# Patient Record
Sex: Female | Born: 1935 | Race: Black or African American | Hispanic: No | Smoking: Never smoker
Health system: Southern US, Community
[De-identification: ages and names within clinical notes are randomized; demographics above are authoritative.]

## PROBLEM LIST (undated history)

## (undated) DIAGNOSIS — C801 Malignant (primary) neoplasm, unspecified: Secondary | ICD-10-CM

## (undated) DIAGNOSIS — R011 Cardiac murmur, unspecified: Secondary | ICD-10-CM

## (undated) DIAGNOSIS — E079 Disorder of thyroid, unspecified: Secondary | ICD-10-CM

## (undated) DIAGNOSIS — I10 Essential (primary) hypertension: Secondary | ICD-10-CM

## (undated) DIAGNOSIS — R42 Dizziness and giddiness: Secondary | ICD-10-CM

## (undated) HISTORY — PX: GASTROSTOMY TUBE PLACEMENT: SHX655

---

## 2005-06-20 ENCOUNTER — Other Ambulatory Visit: Payer: Self-pay

## 2005-06-20 ENCOUNTER — Emergency Department: Payer: Self-pay | Admitting: General Practice

## 2008-12-20 ENCOUNTER — Inpatient Hospital Stay: Payer: Self-pay | Admitting: Internal Medicine

## 2009-01-11 ENCOUNTER — Ambulatory Visit: Payer: Self-pay | Admitting: Family Medicine

## 2009-04-11 ENCOUNTER — Emergency Department: Payer: Self-pay | Admitting: Emergency Medicine

## 2009-12-25 ENCOUNTER — Ambulatory Visit: Payer: Self-pay | Admitting: Unknown Physician Specialty

## 2011-06-20 ENCOUNTER — Ambulatory Visit: Payer: Self-pay | Admitting: Sports Medicine

## 2012-12-25 ENCOUNTER — Emergency Department: Payer: Self-pay | Admitting: Emergency Medicine

## 2012-12-25 LAB — URINALYSIS, COMPLETE
Bacteria: NONE SEEN
Blood: NEGATIVE
Ketone: NEGATIVE
Leukocyte Esterase: NEGATIVE
Protein: NEGATIVE
RBC,UR: 1 /HPF (ref 0–5)
Specific Gravity: 1.012 (ref 1.003–1.030)
Squamous Epithelial: NONE SEEN
WBC UR: <1 /HPF (ref 0–5)

## 2012-12-25 LAB — COMPREHENSIVE METABOLIC PANEL
Albumin: 3.2 g/dL — ABNORMAL LOW (ref 3.4–5.0)
Alkaline Phosphatase: 109 U/L (ref 50–136)
Anion Gap: 3 — ABNORMAL LOW (ref 7–16)
BUN: 33 mg/dL — ABNORMAL HIGH (ref 7–18)
Bilirubin,Total: 0.3 mg/dL (ref 0.2–1.0)
Calcium, Total: 8.9 mg/dL (ref 8.5–10.1)
Chloride: 106 mmol/L (ref 98–107)
Creatinine: 0.93 mg/dL (ref 0.60–1.30)
EGFR (African American): >60
SGPT (ALT): 28 U/L (ref 12–78)
Sodium: 139 mmol/L (ref 136–145)
Total Protein: 6.7 g/dL (ref 6.4–8.2)

## 2012-12-25 LAB — CBC
MCH: 28.3 pg (ref 26.0–34.0)
MCHC: 33 g/dL (ref 32.0–36.0)
Platelet: 110 10*3/uL — ABNORMAL LOW (ref 150–440)
RDW: 13.8 % (ref 11.5–14.5)

## 2015-01-13 ENCOUNTER — Emergency Department
Admission: EM | Admit: 2015-01-13 | Discharge: 2015-01-13 | Disposition: A | Discharge status: Home / Self Care | Payer: Medicare Other | Attending: Emergency Medicine | Admitting: Emergency Medicine

## 2015-01-13 ENCOUNTER — Encounter: Payer: Self-pay | Admitting: Emergency Medicine

## 2015-01-13 DIAGNOSIS — R5383 Other fatigue: Secondary | ICD-10-CM | POA: Diagnosis not present

## 2015-01-13 DIAGNOSIS — I10 Essential (primary) hypertension: Secondary | ICD-10-CM | POA: Insufficient documentation

## 2015-01-13 DIAGNOSIS — Z79899 Other long term (current) drug therapy: Secondary | ICD-10-CM | POA: Insufficient documentation

## 2015-01-13 DIAGNOSIS — R42 Dizziness and giddiness: Secondary | ICD-10-CM | POA: Diagnosis present

## 2015-01-13 DIAGNOSIS — E860 Dehydration: Secondary | ICD-10-CM | POA: Diagnosis not present

## 2015-01-13 DIAGNOSIS — Z7982 Long term (current) use of aspirin: Secondary | ICD-10-CM | POA: Diagnosis not present

## 2015-01-13 HISTORY — DX: Disorder of thyroid, unspecified: E07.9

## 2015-01-13 HISTORY — DX: Essential (primary) hypertension: I10

## 2015-01-13 HISTORY — DX: Dizziness and giddiness: R42

## 2015-01-13 LAB — URINALYSIS COMPLETE WITH MICROSCOPIC (ARMC ONLY)
BILIRUBIN URINE: NEGATIVE
GLUCOSE, UA: NEGATIVE mg/dL
HGB URINE DIPSTICK: NEGATIVE
Ketones, ur: NEGATIVE mg/dL
LEUKOCYTES UA: NEGATIVE
Nitrite: NEGATIVE
Protein, ur: NEGATIVE mg/dL
RBC / HPF: NONE SEEN RBC/hpf (ref 0–5)
SPECIFIC GRAVITY, URINE: 1.01 (ref 1.005–1.030)
Squamous Epithelial / LPF: NONE SEEN
pH: 6 (ref 5.0–8.0)

## 2015-01-13 LAB — COMPREHENSIVE METABOLIC PANEL
ALT: 35 U/L (ref 14–54)
AST: 48 U/L — AB (ref 15–41)
Albumin: 3.3 g/dL — ABNORMAL LOW (ref 3.5–5.0)
Alkaline Phosphatase: 105 U/L (ref 38–126)
Anion gap: 9 (ref 5–15)
BUN: 46 mg/dL — ABNORMAL HIGH (ref 6–20)
CHLORIDE: 99 mmol/L — AB (ref 101–111)
CO2: 30 mmol/L (ref 22–32)
Calcium: 9.2 mg/dL (ref 8.9–10.3)
Creatinine, Ser: 0.92 mg/dL (ref 0.44–1.00)
GFR, EST NON AFRICAN AMERICAN: 58 mL/min — AB (ref >60)
Glucose, Bld: 78 mg/dL (ref 65–99)
POTASSIUM: 4 mmol/L (ref 3.5–5.1)
Sodium: 138 mmol/L (ref 135–145)
TOTAL PROTEIN: 6.8 g/dL (ref 6.5–8.1)

## 2015-01-13 LAB — CBC WITH DIFFERENTIAL/PLATELET
BASOS ABS: 0 10*3/uL (ref 0–0.1)
Basophils Relative: 1 %
EOS PCT: 4 %
Eosinophils Absolute: 0.2 10*3/uL (ref 0–0.7)
HEMATOCRIT: 34.1 % — AB (ref 35.0–47.0)
Hemoglobin: 11.2 g/dL — ABNORMAL LOW (ref 12.0–16.0)
LYMPHS ABS: 0.7 10*3/uL — AB (ref 1.0–3.6)
LYMPHS PCT: 16 %
MCH: 27.4 pg (ref 26.0–34.0)
MCHC: 32.7 g/dL (ref 32.0–36.0)
MCV: 83.8 fL (ref 80.0–100.0)
MONO ABS: 0.5 10*3/uL (ref 0.2–0.9)
MONOS PCT: 12 %
NEUTROS ABS: 3 10*3/uL (ref 1.4–6.5)
Neutrophils Relative %: 67 %
PLATELETS: 138 10*3/uL — AB (ref 150–440)
RBC: 4.07 MIL/uL (ref 3.80–5.20)
RDW: 15.3 % — AB (ref 11.5–14.5)
WBC: 4.5 10*3/uL (ref 3.6–11.0)

## 2015-01-13 LAB — TROPONIN I

## 2015-01-13 MED ORDER — SODIUM CHLORIDE 0.9 % IV BOLUS (SEPSIS)
1000.0000 mL | Freq: Once | INTRAVENOUS | Status: AC
  Administered 2015-01-13: 1000 mL via INTRAVENOUS

## 2015-01-13 NOTE — ED Provider Notes (Signed)
[facility] Emergency Department Provider Note   ____________________________________________  Time seen:  I have reviewed the triage vital signs and the triage nursing note.  HISTORY  Chief Complaint Dizziness   Historian Patient and son and daughter  HPI LUCERITO ROSINSKI is a 81 y.o. female who lives at home with her family, who is here for several days of decreased energy level/fatigue. She did have an episode of room spinning vertigo last week that the family feels like is not completely resolved yet. Patient is denying any room spinning now, and no lightheadedness nor passing out. Symptoms are mild to moderate. Patient feels like she may be dehydrated. She does not take by mouth, and feeds with a G-tube. No abdominal pain. No fevers. No vomiting or diarrhea. No sinus congestion.    Past Medical History  Diagnosis Date  . Vertigo   . Hypertension   . Thyroid disease     There are no active problems to display for this patient.   History reviewed. No pertinent past surgical history.  Current Outpatient Rx  Name  Route  Sig  Dispense  Refill  . aspirin 325 MG tablet   Oral   Take 325 mg by mouth daily.         . chlorthalidone (HYGROTON) 25 MG tablet   Oral   Take 12.5 mg by mouth daily.         Marland Kitchen levothyroxine (SYNTHROID, LEVOTHROID) 100 MCG tablet   Oral   Take 100 mcg by mouth daily.         Marland Kitchen lisinopril (PRINIVIL,ZESTRIL) 40 MG tablet   Oral   Take 40 mg by mouth daily.         . verapamil (CALAN-SR) 120 MG CR tablet   Oral   Take 120 mg by mouth daily.           Allergies Review of patient's allergies indicates no known allergies.  History reviewed. No pertinent family history.  Social History Social History  Substance Use Topics  . Smoking status: Never Smoker   . Smokeless tobacco: None  . Alcohol Use: No    Review of Systems  Constitutional: Negative for fever. Eyes: Negative for visual  changes. ENT: Negative for sore throat. Cardiovascular: Negative for chest pain. Respiratory: Negative for shortness of breath. Gastrointestinal: Negative for abdominal pain, vomiting and diarrhea. Genitourinary: Negative for dysuria. Musculoskeletal: Negative for back pain. Skin: Negative for rash. Neurological: Negative for headache. 10 point Review of Systems otherwise negative ____________________________________________   PHYSICAL EXAM:  VITAL SIGNS: ED Triage Vitals  Enc Vitals Group     BP 01/13/15 0859 146/89 mmHg     Pulse Rate 01/13/15 0902 99     Resp 01/13/15 0859 16     Temp 01/13/15 0859 98.4 F (36.9 C)     Temp Source 01/13/15 0859 Oral     SpO2 01/13/15 0902 100 %     Weight 01/13/15 0902 107 lb (48.535 kg)     Height 01/13/15 0902 5\' 4"  (1.626 m)     Head Cir --      Peak Flow --      Pain Score 01/13/15 0902 0     Pain Loc --      Pain Edu? --      Excl. in [place]? --      Constitutional: Alert and oriented. Well appearing and in no distress. Eyes: Conjunctivae are normal. PERRL. Normal extraocular movements. ENT   Head: Normocephalic  and atraumatic.   Nose: No congestion/rhinnorhea.   Mouth/Throat: Mucous membranes are mildly dry.   Neck: No stridor. Scarring to neck anteriorly. Cardiovascular/Chest: Normal rate, regular rhythm.  No murmurs, rubs, or gallops. Respiratory: Normal respiratory effort without tachypnea nor retractions. Breath sounds are clear and equal bilaterally. No wheezes/rales/rhonchi. Gastrointestinal: Soft. No distention, no guarding, no rebound. Nontender   Genitourinary/rectal:Deferred Musculoskeletal: Nontender with normal range of motion in all extremities. No joint effusions.  No lower extremity tenderness.  No edema. Neurologic:  Normal speech and language. No gross or focal neurologic deficits are appreciated. Skin:  Skin is warm, dry and intact. No rash noted. Psychiatric: Mood and affect are normal. Speech and  behavior are normal. Patient exhibits appropriate insight and judgment.  ____________________________________________   EKG I, Lisa Roca, MD, the attending physician have personally viewed and interpreted all ECGs.  79 bpm. Normal sinus rhythm. Narrow QRS. Normal axis. Normal ST and T-wave. ____________________________________________  LABS (pertinent positives/negatives)  White blood cell count 4.5 with no left shift. Hemoglobin 11.2. Platelet count 138 , Comprehensive metabolic panel significant for BUN 46, creatinine is 0.92 otherwise without significant abnormality Troponin less than 0.03 Urinalysis rare bacteria and otherwise negative  ____________________________________________  RADIOLOGY All Xrays were viewed by me. Imaging interpreted by Radiologist.  None __________________________________________  PROCEDURES  Procedure(s) performed: None  Critical Care performed: None  ____________________________________________   ED COURSE / ASSESSMENT AND PLAN  CONSULTATIONS: None  Pertinent labs & imaging results that were available during my care of the patient were reviewed by me and considered in my medical decision making (see chart for details).   Patient is having generalized fatigue without focal symptoms or any neurologic deficit on exam, nor historical features. Family thinks this is how she has felt before when she is dehydrated, and her BUN is elevated and her mouth is somewhat dry, so she is given a bag of normal saline here in the emergency department.  Patient will be discharged to follow up with her primary care physician.  Patient / Family / Caregiver informed of clinical course, medical decision-making process, and agree with plan.   I discussed return precautions, follow-up instructions, and discharged instructions with patient and/or family.  ___________________________________________   FINAL CLINICAL IMPRESSION(S) / ED DIAGNOSES   Final  diagnoses:  Other fatigue  Dehydration       Lisa Roca, MD 01/13/15 1136

## 2015-01-13 NOTE — Discharge Instructions (Signed)
You were evaluated for fatigue and treated for mild dehydration. Return to the emergency department for any worsening condition including any confusion or altered mental status, fever, weakness or numbness, dizziness or passing out, or any other symptoms concerning to you.  We discussed if you are not improved over the weekend, you should follow up with your primary care physician this week.   Dehydration Dehydration is when you lose more fluids from the body than you take in. Vital organs such as the kidneys, brain, and heart cannot function without a proper amount of fluids and salt. Any loss of fluids from the body can cause dehydration.  Older adults are at a higher risk of dehydration than younger adults. As we age, our bodies are less able to conserve water and do not respond to temperature changes as well. Also, older adults do not become thirsty as easily or quickly. Because of this, older adults often do not realize they need to increase fluids to avoid dehydration.  CAUSES   Vomiting.  Diarrhea.  Excessive sweating.  Excessive urination.  Fever.  Certain medicines, such as blood pressure medicines called diuretics.  Poorly controlled blood sugars. SIGNS AND SYMPTOMS  Mild dehydration:  Thirst.  Dry lips.  Slightly dry mouth. Moderate dehydration:  Very dry mouth.  Sunken eyes.  Skin does not bounce back quickly when lightly pinched and released.  Dark urine and decreased urine production.  Decreased tear production.  Headache. Severe dehydration:  Very dry mouth.  Extreme thirst.  Rapid, weak pulse (more than 100 beats per minute at rest).  Cold hands and feet.  Not able to sweat in spite of heat.  Rapid breathing.  Blue lips.  Confusion and lethargy.  Difficulty being awakened.  Minimal urine production.  No tears. DIAGNOSIS  Your health care provider will diagnose dehydration based on your symptoms and your exam. Blood and urine tests  will help confirm the diagnosis. The diagnostic evaluation should also identify the cause of dehydration. TREATMENT  Treatment of mild or moderate dehydration can often be done at home by increasing the amount of fluids that you drink. It is best to drink small amounts of fluid more often. Drinking too much at one time can make vomiting worse. Severe dehydration needs to be treated at the hospital. You may be given IV fluids that contain water and electrolytes. HOME CARE INSTRUCTIONS   Ask your health care provider about specific rehydration instructions.  Drink enough fluids to keep your urine clear or pale yellow.  Drink small amounts frequently if you have nausea and vomiting.  Eat as you normally do.  Avoid:  Foods or drinks high in sugar.  Carbonated drinks.  Juice.  Extremely hot or cold fluids.  Drinks with caffeine.  Fatty, greasy foods.  Alcohol.  Tobacco.  Overeating.  Gelatin desserts.  Wash your hands well to avoid spreading bacteria and viruses.  Only take over-the-counter or prescription medicines for pain, discomfort, or fever as directed by your health care provider.  Ask your health care provider if you should continue all prescribed and over-the-counter medicines.  Keep all follow-up appointments with your health care provider. SEEK MEDICAL CARE IF:  You have abdominal pain, and it increases or stays in one area (localizes).  You have a rash, stiff neck, or severe headache.  You are irritable, sleepy, or difficult to awaken.  You are weak, dizzy, or extremely thirsty.  You have a fever. SEEK IMMEDIATE MEDICAL CARE IF:   You are unable  to keep fluids down, or you get worse despite treatment.  You have frequent episodes of vomiting or diarrhea.  You have blood or green matter (bile) in your vomit.  You have blood in your stool, or your stool looks black and tarry.  You have not urinated in 6-8 hours, or you have only urinated a small  amount of very dark urine.  You faint. MAKE SURE YOU:   Understand these instructions.  Will watch your condition.  Will get help right away if you are not doing well or get worse.   This information is not intended to replace advice given to you by your health care provider. Make sure you discuss any questions you have with your health care provider.   Document Released: 05/25/2003 Document Revised: 03/09/2013 Document Reviewed: 11/09/2012 Elsevier Interactive Patient Education 2016 [employer].  Fatigue Fatigue is feeling tired all of the time, a lack of energy, or a lack of motivation. Occasional or mild fatigue is often a normal response to activity or life in general. However, long-lasting (chronic) or extreme fatigue may indicate an underlying medical condition. HOME CARE INSTRUCTIONS  Watch your fatigue for any changes. The following actions may help to lessen any discomfort you are feeling:  Talk to your health care provider about how much sleep you need each night. Try to get the required amount every night.  Take medicines only as directed by your health care provider.  Eat a healthy and nutritious diet. Ask your health care provider if you need help changing your diet.  Drink enough fluid to keep your urine clear or pale yellow.  Practice ways of relaxing, such as yoga, meditation, massage therapy, or acupuncture.  Exercise regularly.   Change situations that cause you stress. Try to keep your work and personal routine reasonable.  Do not abuse illegal drugs.  Limit alcohol intake to no more than 1 drink per day for nonpregnant women and 2 drinks per day for men. One drink equals 12 ounces of beer, 5 ounces of wine, or 1 ounces of hard liquor.  Take a multivitamin, if directed by your health care provider. SEEK MEDICAL CARE IF:   Your fatigue does not get better.  You have a fever.   You have unintentional weight loss or gain.  You have headaches.    You have difficulty:   Falling asleep.  Sleeping throughout the night.  You feel angry, guilty, anxious, or sad.   You are unable to have a bowel movement (constipation).   You skin is dry.   Your legs or another part of your body is swollen.  SEEK IMMEDIATE MEDICAL CARE IF:   You feel confused.   Your vision is blurry.  You feel faint or pass out.   You have a severe headache.   You have severe abdominal, pelvic, or back pain.   You have chest pain, shortness of breath, or an irregular or fast heartbeat.   You are unable to urinate or you urinate less than normal.   You develop abnormal bleeding, such as bleeding from the rectum, vagina, nose, lungs, or nipples.  You vomit blood.   You have thoughts about harming yourself or committing suicide.   You are worried that you might harm someone else.    This information is not intended to replace advice given to you by your health care provider. Make sure you discuss any questions you have with your health care provider.   Document Released: 12/30/2006 Document Revised:  03/25/2014 Document Reviewed: 07/06/2013 Elsevier Interactive Patient Education [employer].

## 2015-01-13 NOTE — ED Notes (Signed)
Pt to ed with c/o dizziness x 2 days.  Pt states hx of vertigo,  Denies nausea today.  Pt denies blurred vision, denies headache, denies unilateral weakness.

## 2015-05-30 ENCOUNTER — Emergency Department
Admission: EM | Admit: 2015-05-30 | Discharge: 2015-05-30 | Disposition: A | Discharge status: Home / Self Care | Payer: Medicare Other | Attending: Emergency Medicine | Admitting: Emergency Medicine

## 2015-05-30 ENCOUNTER — Encounter: Payer: Self-pay | Admitting: Emergency Medicine

## 2015-05-30 DIAGNOSIS — I129 Hypertensive chronic kidney disease with stage 1 through stage 4 chronic kidney disease, or unspecified chronic kidney disease: Secondary | ICD-10-CM | POA: Insufficient documentation

## 2015-05-30 DIAGNOSIS — Z7982 Long term (current) use of aspirin: Secondary | ICD-10-CM | POA: Diagnosis not present

## 2015-05-30 DIAGNOSIS — R42 Dizziness and giddiness: Secondary | ICD-10-CM | POA: Diagnosis not present

## 2015-05-30 DIAGNOSIS — Z79899 Other long term (current) drug therapy: Secondary | ICD-10-CM | POA: Insufficient documentation

## 2015-05-30 DIAGNOSIS — N189 Chronic kidney disease, unspecified: Secondary | ICD-10-CM | POA: Diagnosis not present

## 2015-05-30 LAB — CBC
HEMATOCRIT: 31.2 % — AB (ref 35.0–47.0)
HEMOGLOBIN: 10.2 g/dL — AB (ref 12.0–16.0)
MCH: 27.7 pg (ref 26.0–34.0)
MCHC: 32.6 g/dL (ref 32.0–36.0)
MCV: 85.1 fL (ref 80.0–100.0)
Platelets: 126 10*3/uL — ABNORMAL LOW (ref 150–440)
RBC: 3.67 MIL/uL — ABNORMAL LOW (ref 3.80–5.20)
RDW: 14 % (ref 11.5–14.5)
WBC: 5 10*3/uL (ref 3.6–11.0)

## 2015-05-30 LAB — BASIC METABOLIC PANEL
ANION GAP: 2 — AB (ref 5–15)
BUN: 46 mg/dL — AB (ref 6–20)
CHLORIDE: 99 mmol/L — AB (ref 101–111)
CO2: 33 mmol/L — AB (ref 22–32)
Calcium: 8.7 mg/dL — ABNORMAL LOW (ref 8.9–10.3)
Creatinine, Ser: 1.28 mg/dL — ABNORMAL HIGH (ref 0.44–1.00)
GFR calc Af Amer: 45 mL/min — ABNORMAL LOW (ref >60)
GFR, EST NON AFRICAN AMERICAN: 39 mL/min — AB (ref >60)
GLUCOSE: 110 mg/dL — AB (ref 65–99)
POTASSIUM: 3.4 mmol/L — AB (ref 3.5–5.1)
Sodium: 134 mmol/L — ABNORMAL LOW (ref 135–145)

## 2015-05-30 LAB — TROPONIN I: Troponin I: <0.03 ng/mL (ref <0.031)

## 2015-05-30 NOTE — ED Provider Notes (Signed)
[facility] Emergency Department Provider Note  ____________________________________________  Time seen: 8:25 PM  I have reviewed the triage vital signs and the nursing notes.   HISTORY  Chief Complaint Hypotension and Dizziness    HPI Anna Lara is a 82 y.o. female brought to the ED due to dizziness today. She frequently has dizziness at home due to inner ear issues that are chronic and takes medication for. It tends to get worse in when the weather and cold weather such as were currently experiencing where there is a rapid change in the barometric pressure and it is very windy and has undergone for more mother to very cold weather. Patient's daughter does feel like this is consistent with her usual vertigo. They're concerned that maybe her blood pressure was low because she's recently been told to decrease her blood pressure medicine by her kidney doctor because it had been running on the low side recently. She gets tube feeds and has been tolerating those fine without any change in her regimen. Normal bowel movements and output. No other symptoms. No chest pain or shortness of breath. No fever or vomiting.  On arrival to the ED, the patient laid back her head and close her eyes for a while and her symptoms have resolved. She feels back to baseline at this point.   Past Medical History  Diagnosis Date  . Vertigo   . Hypertension   . Thyroid disease      There are no active problems to display for this patient.    History reviewed. No pertinent past surgical history. PEG tube  Current Outpatient Rx  Name  Route  Sig  Dispense  Refill  . aspirin 325 MG tablet   Oral   Take 325 mg by mouth daily.         . chlorthalidone (HYGROTON) 25 MG tablet   Oral   Take 12.5 mg by mouth daily.         Marland Kitchen levothyroxine (SYNTHROID, LEVOTHROID) 100 MCG tablet   Oral   Take 100 mcg by mouth daily.         Marland Kitchen lisinopril (PRINIVIL,ZESTRIL) 40 MG  tablet   Oral   Take 40 mg by mouth daily.         . verapamil (CALAN-SR) 120 MG CR tablet   Oral   Take 120 mg by mouth daily.            Allergies Review of patient's allergies indicates no known allergies.   No family history on file.  Social History Social History  Substance Use Topics  . Smoking status: Never Smoker   . Smokeless tobacco: None  . Alcohol Use: No    Review of Systems  Constitutional:   No fever or chills. No weight changes Eyes:   No blurry vision or double vision.  ENT:   No sore throat.  Cardiovascular:   No chest pain. Respiratory:   No dyspnea or cough. Gastrointestinal:   Negative for abdominal pain, vomiting and diarrhea.  No BRBPR or melena. Genitourinary:   Negative for dysuria or difficulty urinating. Musculoskeletal:   Negative for back pain. No joint swelling or pain. Skin:   Negative for rash. Neurological:   Negative for headaches, focal weakness or numbness. Dizziness today Psychiatric:  No anxiety or depression.   Endocrine:  No changes in energy or sleep difficulty.  10-point ROS otherwise negative.  ____________________________________________   PHYSICAL EXAM:  VITAL SIGNS: ED Triage Vitals  Enc  Vitals Group     BP 05/30/15 1616 122/64 mmHg     Pulse Rate 05/30/15 1616 69     Resp 05/30/15 1616 18     Temp --      Temp Source 05/30/15 1616 Oral     SpO2 05/30/15 1616 100 %     Weight 05/30/15 1616 110 lb (49.896 kg)     Height 05/30/15 1616 5\' 5"  (1.651 m)     Head Cir --      Peak Flow --      Pain Score 05/30/15 1616 4     Pain Loc --      Pain Edu? --      Excl. in [place]? --     Vital signs reviewed, nursing assessments reviewed.   Constitutional:   Alert and oriented. Well appearing and in no distress. Eyes:   No scleral icterus. No conjunctival pallor. PERRL. EOMI ENT   Head:   Normocephalic and atraumatic. External canals and TMs normal bilaterally   Nose:   No congestion/rhinnorhea. No  septal hematoma   Mouth/Throat:   MMM, no pharyngeal erythema. No peritonsillar mass.    Neck:   No stridor. No SubQ emphysema. No meningismus. Hematological/Lymphatic/Immunilogical:   No cervical lymphadenopathy. Cardiovascular:   RRR. Symmetric bilateral radial and DP pulses.  No murmurs.  Respiratory:   Normal respiratory effort without tachypnea nor retractions. Breath sounds are clear and equal bilaterally. No wheezes/rales/rhonchi. Gastrointestinal:   Soft and nontender. Non distended. There is no CVA tenderness.  No rebound, rigidity, or guarding. Genitourinary:   deferred Musculoskeletal:   Nontender with normal range of motion in all extremities. No joint effusions.  No lower extremity tenderness.  No edema. Neurologic:   Normal speech and language.  CN 2-10 normal. Motor grossly intact. No dizziness with change in position. No gross focal neurologic deficits are appreciated.  Skin:    Skin is warm, dry and intact. No rash noted.  No petechiae, purpura, or bullae. Psychiatric:   Mood and affect are normal. ____________________________________________    LABS (pertinent positives/negatives) (all labs ordered are listed, but only abnormal results are displayed) Labs Reviewed  BASIC METABOLIC PANEL - Abnormal; Notable for the following:    Sodium 134 (*)    Potassium 3.4 (*)    Chloride 99 (*)    CO2 33 (*)    Glucose, Bld 110 (*)    BUN 46 (*)    Creatinine, Ser 1.28 (*)    Calcium 8.7 (*)    GFR calc non Af Amer 39 (*)    GFR calc Af Amer 45 (*)    Anion gap 2 (*)    All other components within normal limits  CBC - Abnormal; Notable for the following:    RBC 3.67 (*)    Hemoglobin 10.2 (*)    HCT 31.2 (*)    Platelets 126 (*)    All other components within normal limits  TROPONIN I   ____________________________________________   EKG  Interpreted by me Normal sinus rhythm rate of 68, normal axis and intervals. Left ventricular hypertrophy. Normal ST  segments and T waves.  ____________________________________________    RADIOLOGY    ____________________________________________   PROCEDURES   ____________________________________________   INITIAL IMPRESSION / ASSESSMENT AND PLAN / ED COURSE  Pertinent labs & imaging results that were available during my care of the patient were reviewed by me and considered in my medical decision making (see chart for details).  Patient well appearing  no acute distress. Had some dizziness which is typical for her and consistent with her usual vertigo procedure given the circumstances of her current weather. Labs are unremarkable, vitals are unremarkable. She has chronic renal insufficiency and is at baseline. We'll discharge home continue her usual medications continue to feeds follow-up with primary care. Low suspicion for ACS, UTI pneumonia meningitis encephalitis or sepsis.     ____________________________________________   FINAL CLINICAL IMPRESSION(S) / ED DIAGNOSES  Final diagnoses:  Chronic renal insufficiency, unspecified stage  Vertigo      Carrie Mew, MD 05/30/15 2046

## 2015-05-30 NOTE — Discharge Instructions (Signed)

## 2015-05-30 NOTE — ED Notes (Signed)
Pt family reports that she had headaches and dizziness today - Denied nausea, vomiting, diarrhea

## 2015-05-30 NOTE — ED Notes (Signed)
Patient to ER for c/o hypotension. Daughter states BP was low this am at 100/60. States patient has "inner ear issue" that tends to flare up with windy weather like today. Patient reports dizziness began this am.

## 2015-10-01 ENCOUNTER — Encounter: Payer: Self-pay | Admitting: Emergency Medicine

## 2015-10-01 ENCOUNTER — Emergency Department
Admission: EM | Admit: 2015-10-01 | Discharge: 2015-10-01 | Disposition: A | Discharge status: Home / Self Care | Payer: Medicare Other | Attending: Emergency Medicine | Admitting: Emergency Medicine

## 2015-10-01 ENCOUNTER — Emergency Department: Payer: Medicare Other

## 2015-10-01 DIAGNOSIS — I10 Essential (primary) hypertension: Secondary | ICD-10-CM | POA: Diagnosis not present

## 2015-10-01 DIAGNOSIS — R042 Hemoptysis: Secondary | ICD-10-CM | POA: Insufficient documentation

## 2015-10-01 DIAGNOSIS — Z7982 Long term (current) use of aspirin: Secondary | ICD-10-CM | POA: Diagnosis not present

## 2015-10-01 NOTE — Discharge Instructions (Signed)
Obtain children's liquid Mucinex if needed for mucus. Follow-up with your primary care doctor at [facility] clinic if any continued problems.

## 2015-10-01 NOTE — ED Notes (Signed)
States she has a normal cough at night which clears her throat but states this time she noticed some blood in her phlegm this am   No fever or chills

## 2015-10-01 NOTE — ED Provider Notes (Signed)
[facility] Emergency Department Provider Note   ____________________________________________  Time seen: Approximately 8:34 AM  I have reviewed the triage vital signs and the nursing notes.   HISTORY  Chief Complaint Hemoptysis    HPI Anna Lara is a 82 y.o. female is brought in today by her daughter with complaint of noticing some blood in her flame when she coughed this morning. Patient has a normal coughs every night to clear her throat. Patient's states this is a normal occurrence for her and is not related to allergies. Patient denies any fever or chills. Patient has a feeding tube because she does have difficulty swallowing pills and food. Daughter states that generally when she coughs up  is thick mucus and she was coughing extremely hard this morning. Patient has continued to be ambulatory without assistance and continued her daily routine. Family became concerned when she coughed a small amount of blood up. Patient denies any pain at this time.   Past Medical History  Diagnosis Date  . Vertigo   . Hypertension   . Thyroid disease     There are no active problems to display for this patient.   History reviewed. No pertinent past surgical history.  Current Outpatient Rx  Name  Route  Sig  Dispense  Refill  . aspirin 325 MG tablet   Oral   Take 325 mg by mouth daily.         . chlorthalidone (HYGROTON) 25 MG tablet   Oral   Take 12.5 mg by mouth daily.         Marland Kitchen levothyroxine (SYNTHROID, LEVOTHROID) 100 MCG tablet   Oral   Take 100 mcg by mouth daily.         Marland Kitchen lisinopril (PRINIVIL,ZESTRIL) 40 MG tablet   Oral   Take 40 mg by mouth daily.         . verapamil (CALAN-SR) 120 MG CR tablet   Oral   Take 120 mg by mouth daily.           Allergies Review of patient's allergies indicates no known allergies.  No family history on file.  Social History Social History  Substance Use Topics  . Smoking status:  Never Smoker   . Smokeless tobacco: None  . Alcohol Use: No    Review of Systems Constitutional: No fever/chills Eyes: No visual changes. ENT: No sore throat. Cardiovascular: Denies chest pain. Respiratory: Denies shortness of breath.  Positive for blood tinged sputum today. Gastrointestinal: No abdominal pain.  No nausea, no vomiting.   Genitourinary: Negative for dysuria. Musculoskeletal: Negative for back pain. Skin: Negative for rash. Neurological: Negative for headaches, focal weakness or numbness.  10-point ROS otherwise negative.  ____________________________________________   PHYSICAL EXAM:  VITAL SIGNS: ED Triage Vitals  Enc Vitals Group     BP 10/01/15 0819 118/75 mmHg     Pulse Rate 10/01/15 0819 104     Resp 10/01/15 0819 20     Temp 10/01/15 0819 98.2 F (36.8 C)     Temp Source 10/01/15 0819 Oral     SpO2 10/01/15 0819 99 %     Weight 10/01/15 0819 117 lb (53.071 kg)     Height 10/01/15 0819 5\' 5"  (1.651 m)     Head Cir --      Peak Flow --      Pain Score --      Pain Loc --      Pain Edu? --  Excl. in [place]? --     Constitutional: Alert and oriented. Well appearing and in no acute distress.Very pleasant and talkative. Eyes: Conjunctivae are normal. PERRL. EOMI. Head: Atraumatic. Nose: No congestion/rhinnorhea. Mouth/Throat: Mucous membranes are moist.  Oropharynx non-erythematous.No obvious bleeding. Neck: No stridor.   Hematological/Lymphatic/Immunilogical: No cervical lymphadenopathy. Cardiovascular: There is a grade 3 to 4/6 systolic murmur heard best over the apex. Regular rate and rhythm. Respiratory: Normal respiratory effort.  No retractions. Lungs CTAB. Gastrointestinal: Soft and nontender. No distention.  Musculoskeletal: Moves upper and lower extremities without any difficulty. Neurologic:  Normal speech and language. No gross focal neurologic deficits are appreciated. No gait instability. Skin:  Skin is warm, dry and intact. No rash  noted. Psychiatric: Mood and affect are normal. Speech and behavior are normal.  ____________________________________________   LABS (all labs ordered are listed, but only abnormal results are displayed)  Labs Reviewed - No data to display   RADIOLOGY  Chest x-ray per radiologist it shows no active cardiopulmonary disease. I, [name], personally viewed and evaluated these images (plain radiographs) as part of my medical decision making, as well as reviewing the written report by the radiologist. ____________________________________________   PROCEDURES  Procedure(s) performed: None  Procedures  Critical Care performed: No  ____________________________________________   INITIAL IMPRESSION / ASSESSMENT AND PLAN / ED COURSE  Pertinent labs & imaging results that were available during my care of the patient were reviewed by me and considered in my medical decision making (see chart for details).  Patient was reassured that there was nothing seen on chest x-ray. Daughter states that she generally has thick mucus which is difficult for her to cough up some times. We discussed the possibility of putting some children's Mucinex suspension in her feeding tube to help reduce the consistency of the mucus. Patient is follow-up with her doctor is [facility] if any continued problems. ____________________________________________   FINAL CLINICAL IMPRESSION(S) / ED DIAGNOSES  Final diagnoses:  Cough with hemoptysis      NEW MEDICATIONS STARTED DURING THIS VISIT:  Discharge Medication List as of 10/01/2015  9:18 AM       Note:  This document was prepared using Dragon voice recognition software and may include unintentional dictation errors.    [name], PA-C 10/01/15 0944  [name], PA-C 10/01/15 Yell, MD 10/01/15 2105

## 2015-10-01 NOTE — ED Notes (Addendum)
Pt reports cough with a tinge of blood. Denies fever. Pt states it only happened once.

## 2015-12-07 ENCOUNTER — Emergency Department: Payer: Medicare Other

## 2015-12-07 ENCOUNTER — Emergency Department
Admission: EM | Admit: 2015-12-07 | Discharge: 2015-12-07 | Disposition: A | Discharge status: Home / Self Care | Payer: Medicare Other | Attending: Emergency Medicine | Admitting: Emergency Medicine

## 2015-12-07 ENCOUNTER — Encounter: Payer: Self-pay | Admitting: Emergency Medicine

## 2015-12-07 DIAGNOSIS — J40 Bronchitis, not specified as acute or chronic: Secondary | ICD-10-CM | POA: Diagnosis not present

## 2015-12-07 DIAGNOSIS — Z7982 Long term (current) use of aspirin: Secondary | ICD-10-CM | POA: Diagnosis not present

## 2015-12-07 DIAGNOSIS — Z79899 Other long term (current) drug therapy: Secondary | ICD-10-CM | POA: Diagnosis not present

## 2015-12-07 DIAGNOSIS — R042 Hemoptysis: Secondary | ICD-10-CM | POA: Diagnosis present

## 2015-12-07 DIAGNOSIS — Z85819 Personal history of malignant neoplasm of unspecified site of lip, oral cavity, and pharynx: Secondary | ICD-10-CM | POA: Insufficient documentation

## 2015-12-07 DIAGNOSIS — I10 Essential (primary) hypertension: Secondary | ICD-10-CM | POA: Diagnosis not present

## 2015-12-07 LAB — BASIC METABOLIC PANEL
ANION GAP: 7 (ref 5–15)
BUN: 47 mg/dL — ABNORMAL HIGH (ref 6–20)
CALCIUM: 9 mg/dL (ref 8.9–10.3)
CO2: 31 mmol/L (ref 22–32)
Chloride: 98 mmol/L — ABNORMAL LOW (ref 101–111)
Creatinine, Ser: 1.27 mg/dL — ABNORMAL HIGH (ref 0.44–1.00)
GFR, EST AFRICAN AMERICAN: 45 mL/min — AB (ref >60)
GFR, EST NON AFRICAN AMERICAN: 39 mL/min — AB (ref >60)
Glucose, Bld: 111 mg/dL — ABNORMAL HIGH (ref 65–99)
Potassium: 4 mmol/L (ref 3.5–5.1)
Sodium: 136 mmol/L (ref 135–145)

## 2015-12-07 LAB — CBC
HCT: 31.6 % — ABNORMAL LOW (ref 35.0–47.0)
HEMOGLOBIN: 10.7 g/dL — AB (ref 12.0–16.0)
MCH: 27.6 pg (ref 26.0–34.0)
MCHC: 33.8 g/dL (ref 32.0–36.0)
MCV: 81.6 fL (ref 80.0–100.0)
Platelets: 155 10*3/uL (ref 150–440)
RBC: 3.87 MIL/uL (ref 3.80–5.20)
RDW: 14.2 % (ref 11.5–14.5)
WBC: 5.6 10*3/uL (ref 3.6–11.0)

## 2015-12-07 NOTE — Discharge Instructions (Signed)
These return for increased blood in your coughing with large clots, lightheadedness, chest pain, shortness of breath, fever, or any other new concerns. Please return immediately if condition worsens. Please contact her primary physician or the physician you were given for referral. If you have any specialist physicians involved in her treatment and plan please also contact them. Thank you for using [place] [facility] emergency Department.

## 2015-12-07 NOTE — ED Triage Notes (Signed)
Pt with recent cold and now cough that started yesterday.  Pt to ED after having small "clumps blood" in her sputum.  Pt denies fever.  No shortness of breath noted in triage.  Pt in NAD.

## 2015-12-07 NOTE — ED Provider Notes (Signed)
Time Seen: Approximately 1511  I have reviewed the triage notes  Chief Complaint: Cough and Hemoptysis   History of Present Illness: Anna Lara is a 82 y.o. female who presents with some hemoptysis that started yesterday. She states she started with a dry nonproductive cough, no fever. Patient's had a history of previous cancer with throat surgery and radiation therapy. The patient apparently had a similar episode several months ago. She denies any shortness of breath, calf tenderness or swelling, history of pulmonary embolism, fever, purulent cough or any other concerns. He states it may be swelling down and hasn't noticed any coughing of blood since she's been here over the last several hours.  Past Medical History:  Diagnosis Date  . Hypertension   . Thyroid disease   . Vertigo     There are no active problems to display for this patient.   History reviewed. No pertinent surgical history.  History reviewed. No pertinent surgical history.  Current Outpatient Rx  . Order #: [id number] Class: Historical Med  . Order #: [id number] Class: Historical Med  . Order #: [id number] Class: Historical Med  . Order #: [id number] Class: Historical Med  . Order #: [id number] Class: Historical Med    Allergies:  Review of patient's allergies indicates no known allergies.  Family History: No family history on file.  Social History: Social History  Substance Use Topics  . Smoking status: Never Smoker  . Smokeless tobacco: Never Used  . Alcohol use No     Review of Systems:   10 point review of systems was performed and was otherwise negative:  Constitutional: No fever Eyes: No visual disturbances ENT: No sore throat, ear pain Cardiac: No chest pain Respiratory: No shortness of breath, wheezing, or stridor Abdomen: No abdominal pain, no vomiting, No diarrhea Endocrine: No weight loss, No night sweats Extremities: No peripheral edema, cyanosis Skin: No rashes, easy  bruising Neurologic: No focal weakness, trouble with speech or swollowing Urologic: No dysuria, Hematuria, or urinary frequency   Physical Exam:  ED Triage Vitals  Enc Vitals Group     BP 12/07/15 1212 (!) 132/92     Pulse --      Resp 12/07/15 1212 20     Temp 12/07/15 1212 98.2 F (36.8 C)     Temp Source 12/07/15 1212 Oral     SpO2 12/07/15 1212 100 %     Weight 12/07/15 1214 115 lb (52.2 kg)     Height 12/07/15 1214 5\' 3"  (1.6 m)     Head Circumference --      Peak Flow --      Pain Score 12/07/15 1220 0     Pain Loc --      Pain Edu? --      Excl. in [place]? --     General: Awake , Alert , and Oriented times 3; GCS 15 Head: Normal cephalic , atraumatic Eyes: Pupils equal , round, reactive to light Nose/Throat: No nasal drainage, patent upper airway without erythema or exudate. Scars of radiation therapy. No palpable masses or adenopathy are noted Neck: Supple, Full range of motion, No anterior adenopathy or palpable thyroid masses Lungs: Clear to ascultation without wheezes , rhonchi, or rales Heart: Regular rate, regular rhythm without murmurs , gallops , or rubs Abdomen: Soft, non tender without rebound, guarding , or rigidity; bowel sounds positive and symmetric in all 4 quadrants. No organomegaly .      G tube Extremities: 2 plus symmetric pulses. No edema, clubbing or cyanosis  Neurologic: normal ambulation, Motor symmetric without deficits, sensory intact Skin: warm, dry, no rashes   Labs:   All laboratory work was reviewed including any pertinent negatives or positives listed below:  Labs Reviewed  BASIC METABOLIC PANEL - Abnormal; Notable for the following:       Result Value   Chloride 98 (*)    Glucose, Bld 111 (*)    BUN 47 (*)    Creatinine, Ser 1.27 (*)    GFR calc non Af Amer 39 (*)    GFR calc Af Amer 45 (*)    All other components within normal limits  CBC - Abnormal; Notable for the following:    Hemoglobin 10.7 (*)    HCT 31.6 (*)    All other  components within normal limits  Laboratory work shows no significant changes Radiology: "Dg Chest 2 View  Result Date: 12/07/2015 CLINICAL DATA:  Cough. EXAM: CHEST  2 VIEW COMPARISON:  10/01/2015. FINDINGS: Mediastinum and hilar structures normal. Cardiomegaly with normal pulmonary vascularity. No focal infiltrate. Mild left base pleural parenchymal thickening consistent with scarring again noted. No pleural effusion or pneumothorax. Degenerative changes thoracic spine. IMPRESSION: 1.  Stable cardiomegaly.  No CHF. 2. No acute pulmonary disease. Electronically Signed   By: Marcello Moores  Register   On: 12/07/2015 12:53  "  I personally reviewed the radiologic studies    ED Course:  Patient's currently hemodynamically stable and I felt did not require any further investigation for hemoptysis at this time it certainly seems to be mild with only small clumps of blood. I currently on any anticoagulation therapy and has no shortness of breath, etc. Patient was advised continue with her current medications and continue with Mucinex cough medication on an outpatient basis. She was advised to contact her oncologist for further follow-up as soon as possible. She was advised to return here if she develops any shortness of breath, fever, increased bleeding or any other new concerns.  Clinical Course     Assessment:  Acute bronchitis Mild hemoptysis   Final Clinical Impression:  Final diagnoses:  Bronchitis  Cough with hemoptysis     Plan:  Outpatient Patient was advised to return immediately if condition worsens. Patient was advised to follow up with their primary care physician or other specialized physicians involved in their outpatient care. The patient and/or family member/power of attorney had laboratory results reviewed at the bedside. All questions and concerns were addressed and appropriate discharge instructions were distributed by the nursing staff.            Daymon Larsen,  MD 12/07/15 [phone]

## 2016-05-25 ENCOUNTER — Emergency Department
Admission: EM | Admit: 2016-05-25 | Discharge: 2016-05-25 | Disposition: A | Discharge status: Home / Self Care | Payer: Medicare Other | Attending: Student in an Organized Health Care Education/Training Program | Admitting: Student in an Organized Health Care Education/Training Program

## 2016-05-25 DIAGNOSIS — R531 Weakness: Secondary | ICD-10-CM | POA: Diagnosis present

## 2016-05-25 DIAGNOSIS — E860 Dehydration: Secondary | ICD-10-CM

## 2016-05-25 DIAGNOSIS — D509 Iron deficiency anemia, unspecified: Secondary | ICD-10-CM

## 2016-05-25 DIAGNOSIS — I10 Essential (primary) hypertension: Secondary | ICD-10-CM | POA: Diagnosis not present

## 2016-05-25 DIAGNOSIS — Z7982 Long term (current) use of aspirin: Secondary | ICD-10-CM | POA: Diagnosis not present

## 2016-05-25 DIAGNOSIS — Z79899 Other long term (current) drug therapy: Secondary | ICD-10-CM | POA: Insufficient documentation

## 2016-05-25 HISTORY — DX: Cardiac murmur, unspecified: R01.1

## 2016-05-25 LAB — BASIC METABOLIC PANEL
ANION GAP: 6 (ref 5–15)
Anion gap: 4 — ABNORMAL LOW (ref 5–15)
BUN: 65 mg/dL — ABNORMAL HIGH (ref 6–20)
BUN: 59 mg/dL — AB (ref 6–20)
CALCIUM: 7.9 mg/dL — AB (ref 8.9–10.3)
CHLORIDE: 104 mmol/L (ref 101–111)
CHLORIDE: 109 mmol/L (ref 101–111)
CO2: 30 mmol/L (ref 22–32)
CO2: 28 mmol/L (ref 22–32)
CREATININE: 1.18 mg/dL — AB (ref 0.44–1.00)
Calcium: 8.5 mg/dL — ABNORMAL LOW (ref 8.9–10.3)
Creatinine, Ser: 1.48 mg/dL — ABNORMAL HIGH (ref 0.44–1.00)
GFR calc Af Amer: 37 mL/min — ABNORMAL LOW (ref >60)
GFR calc non Af Amer: 32 mL/min — ABNORMAL LOW (ref >60)
GFR calc non Af Amer: 42 mL/min — ABNORMAL LOW (ref >60)
GFR, EST AFRICAN AMERICAN: 49 mL/min — AB (ref >60)
Glucose, Bld: 102 mg/dL — ABNORMAL HIGH (ref 65–99)
Glucose, Bld: 96 mg/dL (ref 65–99)
POTASSIUM: 4 mmol/L (ref 3.5–5.1)
Potassium: 3.5 mmol/L (ref 3.5–5.1)
SODIUM: 140 mmol/L (ref 135–145)
SODIUM: 141 mmol/L (ref 135–145)

## 2016-05-25 LAB — URINALYSIS, COMPLETE (UACMP) WITH MICROSCOPIC
BACTERIA UA: NONE SEEN
BILIRUBIN URINE: NEGATIVE
Glucose, UA: NEGATIVE mg/dL
HGB URINE DIPSTICK: NEGATIVE
Ketones, ur: NEGATIVE mg/dL
LEUKOCYTES UA: NEGATIVE
NITRITE: NEGATIVE
PH: 5 (ref 5.0–8.0)
Protein, ur: NEGATIVE mg/dL
RBC / HPF: NONE SEEN RBC/hpf (ref 0–5)
SQUAMOUS EPITHELIAL / LPF: NONE SEEN
Specific Gravity, Urine: 1.014 (ref 1.005–1.030)

## 2016-05-25 LAB — CBC
HEMATOCRIT: 25.8 % — AB (ref 35.0–47.0)
Hemoglobin: 8.3 g/dL — ABNORMAL LOW (ref 12.0–16.0)
MCH: 23.3 pg — ABNORMAL LOW (ref 26.0–34.0)
MCHC: 32.3 g/dL (ref 32.0–36.0)
MCV: 72.1 fL — AB (ref 80.0–100.0)
Platelets: 166 10*3/uL (ref 150–440)
RBC: 3.58 MIL/uL — ABNORMAL LOW (ref 3.80–5.20)
RDW: 17.3 % — ABNORMAL HIGH (ref 11.5–14.5)
WBC: 5.1 10*3/uL (ref 3.6–11.0)

## 2016-05-25 MED ORDER — SODIUM CHLORIDE 0.9 % IV BOLUS (SEPSIS)
1000.0000 mL | Freq: Once | INTRAVENOUS | Status: AC
  Administered 2016-05-25: 1000 mL via INTRAVENOUS

## 2016-05-25 MED ORDER — FERROUS SULFATE 75 (15 FE) MG/ML PO SOLN
15.0000 mg | Freq: Once | ORAL | Status: AC
  Administered 2016-05-25: 15 mg via ORAL
  Filled 2016-05-25: qty 1

## 2016-05-25 MED ORDER — IRON 15 MG/1.5ML PO SUSP: 30.0000 mg | Freq: Two times a day (BID) | ORAL | 2 refills | Status: DC

## 2016-05-25 NOTE — ED Provider Notes (Addendum)
[facility] Emergency Department Provider Note    None    (approximate)  I have reviewed the triage vital signs and the nursing notes.   HISTORY  Chief Complaint Weakness    HPI Anna Lara is a 83 y.o. female presents with chief complaint of "not feeling well" that started yesterday.  Primary complaints are tired, decreased appetite. Decreased oral hydration. Feeling "down." Patient recently lost her brother and has been struggling with his passing. Has a G tube in place and is unable to take any oralfood secondary to history of esophageal cancer and subsequent stenosis. She denies any pain. No chest pain or shortness of breath. No nausea or vomiting. Denies any SI or HI. Has been taking her medications as directed. Denies any dark stool or blood in her stool. There is a remote history of gastric ulcer.   Past Medical History:  Diagnosis Date  . Heart murmur   . Hypertension   . Thyroid disease   . Vertigo    No family history on file. Past Surgical History:  Procedure Laterality Date  . GASTROSTOMY TUBE PLACEMENT     There are no active problems to display for this patient.     Prior to Admission medications   Medication Sig Start Date End Date Taking? Authorizing Provider  aspirin EC 325 MG tablet Take 325 mg by mouth every morning.    Historical Provider, MD  chlorthalidone (HYGROTON) 25 MG tablet Take 12.5 mg by mouth every morning.     Historical Provider, MD  Iron 15 MG/1.5ML SUSP Take 3 mLs (30 mg total) by mouth 2 (two) times daily. Take every other day. 05/25/16   Merlyn Lot, MD  levothyroxine (SYNTHROID, LEVOTHROID) 100 MCG tablet Take 100 mcg by mouth every morning.     Historical Provider, MD  lisinopril (PRINIVIL,ZESTRIL) 40 MG tablet Take 40 mg by mouth every morning.     Historical Provider, MD  verapamil (CALAN) 40 MG tablet Take 40 mg by mouth 2 (two) times daily.    Historical Provider, MD    Allergies Patient  has no known allergies.    Social History Social History  Substance Use Topics  . Smoking status: Never Smoker  . Smokeless tobacco: Never Used  . Alcohol use No    Review of Systems Patient denies headaches, rhinorrhea, blurry vision, numbness, shortness of breath, chest pain, edema, cough, abdominal pain, nausea, vomiting, diarrhea, dysuria, fevers, rashes or hallucinations unless otherwise stated above in HPI. ____________________________________________   PHYSICAL EXAM:  VITAL SIGNS: Vitals:   05/25/16 1731 05/25/16 1800  BP: 116/66 105/76  Pulse: 73 95  Resp: (!) 22 15  Temp:      Constitutional: Alert and oriented. frail appearing, in no acute distress. Eyes: Conjunctivae are normal. PERRL. EOMI. Head: Atraumatic. Nose: No congestion/rhinnorhea. Mouth/Throat: Mucous membranes are moist.  Oropharynx non-erythematous. Neck: No stridor. Painless ROM. No cervical spine tenderness to palpation Hematological/Lymphatic/Immunilogical: No cervical lymphadenopathy. Cardiovascular: Normal rate, regular rhythm. Grossly normal heart sounds.  Good peripheral circulation. Respiratory: Normal respiratory effort.  No retractions. Lungs CTAB. Gastrointestinal: Soft and nontender. No distention. No abdominal bruits. No CVA tenderness. Genitourinary: guaiac negative Musculoskeletal: No lower extremity tenderness nor edema.  No joint effusions. Neurologic:  Normal speech and language. No gross focal neurologic deficits are appreciated. No gait instability. Skin:  Skin is warm, dry and intact. No rash noted. Psychiatric: Mood is depressed, appears withdrawan ____________________________________________   LABS (all labs ordered are listed, but only  abnormal results are displayed)  Results for orders placed or performed during the hospital encounter of 05/25/16 (from the past 24 hour(s))  Basic metabolic panel     Status: Abnormal   Collection Time: 05/25/16 12:56 PM  Result Value  Ref Range   Sodium 140 135 - 145 mmol/L   Potassium 4.0 3.5 - 5.1 mmol/L   Chloride 104 101 - 111 mmol/L   CO2 30 22 - 32 mmol/L   Glucose, Bld 102 (H) 65 - 99 mg/dL   BUN 65 (H) 6 - 20 mg/dL   Creatinine, Ser 1.48 (H) 0.44 - 1.00 mg/dL   Calcium 8.5 (L) 8.9 - 10.3 mg/dL   GFR calc non Af Amer 32 (L) >60 mL/min   GFR calc Af Amer 37 (L) >60 mL/min   Anion gap 6 5 - 15  CBC     Status: Abnormal   Collection Time: 05/25/16 12:56 PM  Result Value Ref Range   WBC 5.1 3.6 - 11.0 K/uL   RBC 3.58 (L) 3.80 - 5.20 MIL/uL   Hemoglobin 8.3 (L) 12.0 - 16.0 g/dL   HCT 25.8 (L) 35.0 - 47.0 %   MCV 72.1 (L) 80.0 - 100.0 fL   MCH 23.3 (L) 26.0 - 34.0 pg   MCHC 32.3 32.0 - 36.0 g/dL   RDW 17.3 (H) 11.5 - 14.5 %   Platelets 166 150 - 440 K/uL  Urinalysis, Complete w Microscopic     Status: Abnormal   Collection Time: 05/25/16 12:56 PM  Result Value Ref Range   Color, Urine YELLOW (A) YELLOW   APPearance CLEAR (A) CLEAR   Specific Gravity, Urine 1.014 1.005 - 1.030   pH 5.0 5.0 - 8.0   Glucose, UA NEGATIVE NEGATIVE mg/dL   Hgb urine dipstick NEGATIVE NEGATIVE   Bilirubin Urine NEGATIVE NEGATIVE   Ketones, ur NEGATIVE NEGATIVE mg/dL   Protein, ur NEGATIVE NEGATIVE mg/dL   Nitrite NEGATIVE NEGATIVE   Leukocytes, UA NEGATIVE NEGATIVE   RBC / HPF NONE SEEN 0 - 5 RBC/hpf   WBC, UA 0-5 0 - 5 WBC/hpf   Bacteria, UA NONE SEEN NONE SEEN   Squamous Epithelial / LPF NONE SEEN NONE SEEN   Mucous PRESENT   Basic metabolic panel     Status: Abnormal   Collection Time: 05/25/16  5:12 PM  Result Value Ref Range   Sodium 141 135 - 145 mmol/L   Potassium 3.5 3.5 - 5.1 mmol/L   Chloride 109 101 - 111 mmol/L   CO2 28 22 - 32 mmol/L   Glucose, Bld 96 65 - 99 mg/dL   BUN 59 (H) 6 - 20 mg/dL   Creatinine, Ser 1.18 (H) 0.44 - 1.00 mg/dL   Calcium 7.9 (L) 8.9 - 10.3 mg/dL   GFR calc non Af Amer 42 (L) >60 mL/min   GFR calc Af Amer 49 (L) >60 mL/min   Anion gap 4 (L) 5 - 15    ____________________________________________  EKG My review and personal interpretation at Time: 12:56   Indication: weakness  Rate: 90  Rhythm: sinus Axis: normal Other: normal intervals, no STEMI ____________________________________________  RADIOLOGY  I personally reviewed all radiographic images ordered to evaluate for the above acute complaints and reviewed radiology reports and findings.  These findings were personally discussed with the patient.  Please see medical record for radiology report.  ____________________________________________   PROCEDURES  Procedure(s) performed:  Procedures    Critical Care performed: no ____________________________________________   INITIAL IMPRESSION / ASSESSMENT  AND PLAN / ED COURSE  Pertinent labs & imaging results that were available during my care of the patient were reviewed by me and considered in my medical decision making (see chart for details).  DDX: gi bleed, anemia, dehydration, aki, malnutrition, depression  Anna Lara is a 83 y.o. who presents to the ED with Generalized weakness and vague complaints. Patient afebrile hemodynamic stable.  No focal neurologic deficits at this time. Blood work consistent with mild anemia that appears to be microcytic in consistent with IDA.  Possible component of renal insufficiency the patient does not have any true AKI. Does appear dehydrated with a BUN of 60.   Guaiac is negative. No evidence of GI bleed. Likely secondary to poor oral intake.  Will provide IVF and reassess.    ----------------------------------------- 6:15 PM on 05/25/2016 -----------------------------------------  Repeat BMP with improvement. Now much closer to baseline. Patient is requesting discharge home. Have offered and recommended admission to hospital for additional IV fluids but patient states that she preferred to go home.  Discussed appropriate management with daughter and husband.  Encouraged patient  to increase intake through G tube.  Spoke with debra (patient's daughter) regarding signs and symptoms for which patient should return to the Er.  Have discussed with the patient and available family all diagnostics and treatments performed thus far and all questions were answered to the best of my ability. The patient demonstrates understanding and agreement with plan.   ____________________________________________   FINAL CLINICAL IMPRESSION(S) / ED DIAGNOSES  Final diagnoses:  Dehydration  Generalized weakness  Iron deficiency anemia, unspecified iron deficiency anemia type      NEW MEDICATIONS STARTED DURING THIS VISIT:  Discharge Medication List as of 05/25/2016  6:22 PM       Note:  This document was prepared using Dragon voice recognition software and may include unintentional dictation errors.    Merlyn Lot, MD 05/25/16 1640    Merlyn Lot, MD 05/25/16 [phone]

## 2016-05-25 NOTE — ED Notes (Addendum)
Daughter at bedside, pt states "not feeling well" since yesterday, states feeling tired, denies any specific complaints, daughter states pt has been battling with depression and states "its one of those days", denies any pain, G tube in place with hx of esophagus cancer, awake and alert in no acute distress

## 2016-05-25 NOTE — ED Triage Notes (Signed)
Pt reports to ED w/ c/o "not feeling well". Sts that it began yesterday evening. Pt A/OX4, resp even and unlabored. Pt denies CP, SOB, fevers, or n/v/d.  Pt reports feeling weak, not specific to one side.  Pt sts that she staggers when standing and that is not her normal. NAD

## 2016-05-25 NOTE — ED Notes (Signed)
EDP at bedside  

## 2016-05-25 NOTE — ED Notes (Signed)
Pt resting in bed, daughter at bedside, resp even and unlabored

## 2016-09-28 ENCOUNTER — Emergency Department: Payer: Medicare Other

## 2016-09-28 ENCOUNTER — Observation Stay
Admission: EM | Admit: 2016-09-28 | Discharge: 2016-09-29 | Disposition: A | Discharge status: Home / Self Care | Payer: Medicare Other | Attending: Internal Medicine | Admitting: Internal Medicine

## 2016-09-28 ENCOUNTER — Encounter: Payer: Self-pay | Admitting: Emergency Medicine

## 2016-09-28 DIAGNOSIS — Z79899 Other long term (current) drug therapy: Secondary | ICD-10-CM | POA: Diagnosis not present

## 2016-09-28 DIAGNOSIS — I10 Essential (primary) hypertension: Secondary | ICD-10-CM | POA: Insufficient documentation

## 2016-09-28 DIAGNOSIS — B961 Klebsiella pneumoniae [K. pneumoniae] as the cause of diseases classified elsewhere: Secondary | ICD-10-CM | POA: Insufficient documentation

## 2016-09-28 DIAGNOSIS — Z1611 Resistance to penicillins: Secondary | ICD-10-CM | POA: Diagnosis not present

## 2016-09-28 DIAGNOSIS — E876 Hypokalemia: Secondary | ICD-10-CM | POA: Diagnosis not present

## 2016-09-28 DIAGNOSIS — E860 Dehydration: Principal | ICD-10-CM | POA: Diagnosis present

## 2016-09-28 DIAGNOSIS — N179 Acute kidney failure, unspecified: Secondary | ICD-10-CM | POA: Diagnosis not present

## 2016-09-28 DIAGNOSIS — I700 Atherosclerosis of aorta: Secondary | ICD-10-CM | POA: Diagnosis not present

## 2016-09-28 DIAGNOSIS — N390 Urinary tract infection, site not specified: Secondary | ICD-10-CM | POA: Diagnosis present

## 2016-09-28 DIAGNOSIS — Z8501 Personal history of malignant neoplasm of esophagus: Secondary | ICD-10-CM | POA: Diagnosis not present

## 2016-09-28 DIAGNOSIS — E871 Hypo-osmolality and hyponatremia: Secondary | ICD-10-CM | POA: Diagnosis not present

## 2016-09-28 DIAGNOSIS — Z931 Gastrostomy status: Secondary | ICD-10-CM | POA: Insufficient documentation

## 2016-09-28 DIAGNOSIS — K219 Gastro-esophageal reflux disease without esophagitis: Secondary | ICD-10-CM | POA: Insufficient documentation

## 2016-09-28 DIAGNOSIS — J449 Chronic obstructive pulmonary disease, unspecified: Secondary | ICD-10-CM | POA: Diagnosis not present

## 2016-09-28 DIAGNOSIS — Z7982 Long term (current) use of aspirin: Secondary | ICD-10-CM | POA: Diagnosis not present

## 2016-09-28 DIAGNOSIS — E039 Hypothyroidism, unspecified: Secondary | ICD-10-CM | POA: Diagnosis present

## 2016-09-28 DIAGNOSIS — Z923 Personal history of irradiation: Secondary | ICD-10-CM | POA: Diagnosis not present

## 2016-09-28 LAB — URINALYSIS, COMPLETE (UACMP) WITH MICROSCOPIC
Bilirubin Urine: NEGATIVE
GLUCOSE, UA: NEGATIVE mg/dL
Hgb urine dipstick: NEGATIVE
KETONES UR: NEGATIVE mg/dL
Nitrite: NEGATIVE
PROTEIN: NEGATIVE mg/dL
Specific Gravity, Urine: 1.009 (ref 1.005–1.030)
pH: 6 (ref 5.0–8.0)

## 2016-09-28 LAB — BASIC METABOLIC PANEL
ANION GAP: 8 (ref 5–15)
BUN: 64 mg/dL — ABNORMAL HIGH (ref 6–20)
CHLORIDE: 95 mmol/L — AB (ref 101–111)
CO2: 28 mmol/L (ref 22–32)
CREATININE: 1.33 mg/dL — AB (ref 0.44–1.00)
Calcium: 8.6 mg/dL — ABNORMAL LOW (ref 8.9–10.3)
GFR calc non Af Amer: 36 mL/min — ABNORMAL LOW (ref >60)
GFR, EST AFRICAN AMERICAN: 42 mL/min — AB (ref >60)
Glucose, Bld: 121 mg/dL — ABNORMAL HIGH (ref 65–99)
POTASSIUM: 3 mmol/L — AB (ref 3.5–5.1)
SODIUM: 131 mmol/L — AB (ref 135–145)

## 2016-09-28 LAB — TSH: TSH: 16.317 u[IU]/mL — ABNORMAL HIGH (ref 0.350–4.500)

## 2016-09-28 LAB — T4, FREE: Free T4: 1.04 ng/dL (ref 0.61–1.12)

## 2016-09-28 LAB — CBC
HCT: 32.5 % — ABNORMAL LOW (ref 35.0–47.0)
Hemoglobin: 10.5 g/dL — ABNORMAL LOW (ref 12.0–16.0)
MCH: 25.6 pg — ABNORMAL LOW (ref 26.0–34.0)
MCHC: 32.3 g/dL (ref 32.0–36.0)
MCV: 79.4 fL — AB (ref 80.0–100.0)
PLATELETS: 148 10*3/uL — AB (ref 150–440)
RBC: 4.1 MIL/uL (ref 3.80–5.20)
RDW: 17 % — ABNORMAL HIGH (ref 11.5–14.5)
WBC: 4.8 10*3/uL (ref 3.6–11.0)

## 2016-09-28 LAB — TROPONIN I: Troponin I: 0.03 ng/mL (ref <0.03)

## 2016-09-28 MED ORDER — VERAPAMIL HCL 40 MG PO TABS
40.0000 mg | ORAL_TABLET | Freq: Every day | ORAL | Status: DC
  Administered 2016-09-29: 40 mg via ORAL
  Filled 2016-09-28: qty 1

## 2016-09-28 MED ORDER — ORAL CARE MOUTH RINSE
15.0000 mL | Freq: Two times a day (BID) | OROMUCOSAL | Status: DC
  Administered 2016-09-28: 21:00:00 15 mL via OROMUCOSAL

## 2016-09-28 MED ORDER — IRON 15 MG/1.5ML PO SUSP: 30.0000 mg | Freq: Every day | ORAL | Status: DC

## 2016-09-28 MED ORDER — DEXTROSE 5 % IV SOLN
2.0000 g | Freq: Once | INTRAVENOUS | Status: AC
  Administered 2016-09-28: 2 g via INTRAVENOUS
  Filled 2016-09-28: qty 2

## 2016-09-28 MED ORDER — LEVOTHYROXINE SODIUM 100 MCG PO TABS
100.0000 ug | ORAL_TABLET | Freq: Once | ORAL | Status: AC
  Administered 2016-09-28: 100 ug via ORAL
  Filled 2016-09-28: qty 2
  Filled 2016-09-28: qty 1

## 2016-09-28 MED ORDER — LEVOTHYROXINE SODIUM 100 MCG PO TABS
100.0000 ug | ORAL_TABLET | ORAL | Status: DC
  Administered 2016-09-29: 100 ug via ORAL
  Filled 2016-09-28: qty 1

## 2016-09-28 MED ORDER — POTASSIUM CHLORIDE 20 MEQ PO PACK
40.0000 meq | PACK | Freq: Two times a day (BID) | ORAL | Status: DC
  Administered 2016-09-28 – 2016-09-29 (×3): 40 meq via ORAL
  Filled 2016-09-28 (×3): qty 2

## 2016-09-28 MED ORDER — ASPIRIN EC 81 MG PO TBEC
81.0000 mg | DELAYED_RELEASE_TABLET | Freq: Every day | ORAL | Status: DC
  Administered 2016-09-29: 81 mg via ORAL
  Filled 2016-09-28: qty 1

## 2016-09-28 MED ORDER — ENSURE ENLIVE PO LIQD
237.0000 mL | Freq: Four times a day (QID) | ORAL | Status: DC
  Administered 2016-09-28 – 2016-09-29 (×3): 237 mL

## 2016-09-28 MED ORDER — POTASSIUM CHLORIDE IN NACL 20-0.9 MEQ/L-% IV SOLN
INTRAVENOUS | Status: DC
  Administered 2016-09-28 – 2016-09-29 (×2): via INTRAVENOUS
  Filled 2016-09-28 (×4): qty 1000

## 2016-09-28 MED ORDER — DEXTROSE 5 % IV SOLN
1.0000 g | INTRAVENOUS | Status: DC
  Filled 2016-09-28: qty 10

## 2016-09-28 MED ORDER — CHLORTHALIDONE 25 MG PO TABS
25.0000 mg | ORAL_TABLET | Freq: Every day | ORAL | Status: DC
  Administered 2016-09-29: 09:00:00 25 mg via ORAL
  Filled 2016-09-28: qty 1

## 2016-09-28 MED ORDER — POTASSIUM CHLORIDE CRYS ER 20 MEQ PO TBCR: 40.0000 meq | EXTENDED_RELEASE_TABLET | Freq: Once | ORAL | Status: DC

## 2016-09-28 MED ORDER — LISINOPRIL 20 MG PO TABS
40.0000 mg | ORAL_TABLET | ORAL | Status: DC
  Administered 2016-09-29: 09:00:00 40 mg via ORAL
  Filled 2016-09-28: qty 2

## 2016-09-28 MED ORDER — SODIUM CHLORIDE 0.9 % IV BOLUS (SEPSIS)
1000.0000 mL | Freq: Once | INTRAVENOUS | Status: AC
  Administered 2016-09-28: 1000 mL via INTRAVENOUS

## 2016-09-28 MED ORDER — FERROUS SULFATE NICU 15 MG (ELEMENTAL IRON)/ML
30.0000 mg | Freq: Every day | ORAL | Status: DC
  Administered 2016-09-29: 09:00:00 30 mg via ORAL
  Filled 2016-09-28: qty 2

## 2016-09-28 NOTE — ED Notes (Signed)
Meds given through G tube

## 2016-09-28 NOTE — ED Provider Notes (Signed)
[facility] Emergency Department Provider Note    First MD Initiated Contact with Patient 09/28/16 1209     (approximate)  I have reviewed the triage vital signs and the nursing notes.   HISTORY  Chief Complaint Weakness    HPI Anna Lara is a 83 y.o. female presents with chief complaint of 3-4 days of generalized weakness that is worse when she standing up. Denies any lateralizing weakness or numbness or tingling. No fevers. States that she is going to the restroom more frequently stating that she's having to P every 15-20 minutes. Denies any nausea or vomiting. She does have a history of esophageal cancer and therefore receives the majority of her nutrition through a G-tube. Has been trying to stay hydrated as she has a history of becoming dehydrated in the past. Denies any chest pain or shortness of breath. No other complaints.   Past Medical History:  Diagnosis Date  . Heart murmur   . Hypertension   . Thyroid disease   . Vertigo    No family history on file. Past Surgical History:  Procedure Laterality Date  . GASTROSTOMY TUBE PLACEMENT     There are no active problems to display for this patient.     Prior to Admission medications   Medication Sig Start Date End Date Taking? Authorizing Provider  aspirin EC 325 MG tablet Take 325 mg by mouth every morning.    [provider]  chlorthalidone (HYGROTON) 25 MG tablet Take 12.5 mg by mouth every morning.     [provider]  Iron 15 MG/1.5ML SUSP Take 3 mLs (30 mg total) by mouth 2 (two) times daily. Take every other day. 05/25/16   Merlyn Lot, MD  levothyroxine (SYNTHROID, LEVOTHROID) 100 MCG tablet Take 100 mcg by mouth every morning.     [provider]  lisinopril (PRINIVIL,ZESTRIL) 40 MG tablet Take 40 mg by mouth every morning.     [provider]  verapamil (CALAN) 40 MG tablet Take 40 mg by mouth 2 (two) times daily.    [provider]    Allergies Patient has no known allergies.    Social History Social History  Substance Use Topics  . Smoking status: Never Smoker  . Smokeless tobacco: Never Used  . Alcohol use No    Review of Systems Patient denies headaches, rhinorrhea, blurry vision, numbness, shortness of breath, chest pain, edema, cough, abdominal pain, nausea, vomiting, diarrhea, dysuria, fevers, rashes or hallucinations unless otherwise stated above in HPI. ____________________________________________   PHYSICAL EXAM:  VITAL SIGNS: Vitals:   09/28/16 1054  BP: 107/60  Pulse: 90  Resp: 16  Temp: 98.1 F (36.7 C)    Constitutional: Alert and oriented. Chronically ill appearing but in no acute distress. Eyes: Conjunctivae are normal.  Head: Atraumatic. Nose: No congestion/rhinnorhea. Mouth/Throat: Mucous membranes are moist.   Neck: No stridor. Painless ROM.  Cardiovascular: Normal rate, regular rhythm. Grossly normal heart sounds.  Good peripheral circulation. Respiratory: Normal respiratory effort.  No retractions. Lungs CTAB. Gastrointestinal: Soft and nontender. No distention. No abdominal bruits. No CVA tenderness.  gtube in place and ostomy is c/d/i Musculoskeletal: No lower extremity tenderness nor edema.  No joint effusions. Neurologic:  Normal speech and language. No gross focal neurologic deficits are appreciated. No facial droop Skin:  Skin is warm, dry and intact. No rash noted. Psychiatric: Mood and affect are normal. Speech and behavior are normal.  ____________________________________________   LABS (all labs ordered are  listed, but only abnormal results are displayed)  Results for orders placed or performed during the hospital encounter of 09/28/16 (from the past 24 hour(s))  Basic metabolic panel     Status: Abnormal   Collection Time: 09/28/16 11:00 AM  Result Value Ref Range   Sodium 131 (L) 135 - 145 mmol/L   Potassium 3.0 (L) 3.5 - 5.1 mmol/L    Chloride 95 (L) 101 - 111 mmol/L   CO2 28 22 - 32 mmol/L   Glucose, Bld 121 (H) 65 - 99 mg/dL   BUN 64 (H) 6 - 20 mg/dL   Creatinine, Ser 1.33 (H) 0.44 - 1.00 mg/dL   Calcium 8.6 (L) 8.9 - 10.3 mg/dL   GFR calc non Af Amer 36 (L) >60 mL/min   GFR calc Af Amer 42 (L) >60 mL/min   Anion gap 8 5 - 15  CBC     Status: Abnormal   Collection Time: 09/28/16 11:00 AM  Result Value Ref Range   WBC 4.8 3.6 - 11.0 K/uL   RBC 4.10 3.80 - 5.20 MIL/uL   Hemoglobin 10.5 (L) 12.0 - 16.0 g/dL   HCT 32.5 (L) 35.0 - 47.0 %   MCV 79.4 (L) 80.0 - 100.0 fL   MCH 25.6 (L) 26.0 - 34.0 pg   MCHC 32.3 32.0 - 36.0 g/dL   RDW 17.0 (H) 11.5 - 14.5 %   Platelets 148 (L) 150 - 440 K/uL  Urinalysis, Complete w Microscopic     Status: Abnormal   Collection Time: 09/28/16 11:00 AM  Result Value Ref Range   Color, Urine YELLOW (A) YELLOW   APPearance HAZY (A) CLEAR   Specific Gravity, Urine 1.009 1.005 - 1.030   pH 6.0 5.0 - 8.0   Glucose, UA NEGATIVE NEGATIVE mg/dL   Hgb urine dipstick NEGATIVE NEGATIVE   Bilirubin Urine NEGATIVE NEGATIVE   Ketones, ur NEGATIVE NEGATIVE mg/dL   Protein, ur NEGATIVE NEGATIVE mg/dL   Nitrite NEGATIVE NEGATIVE   Leukocytes, UA LARGE (A) NEGATIVE   RBC / HPF 0-5 0 - 5 RBC/hpf   WBC, UA 6-30 0 - 5 WBC/hpf   Bacteria, UA FEW (A) NONE SEEN   Squamous Epithelial / LPF 0-5 (A) NONE SEEN   ____________________________________________  EKG My review and personal interpretation at Time: 11:00   Indication: weakness  Rate: 80  Rhythm: sinus Axis: normal Other: normal intervals, no stemi ____________________________________________  RADIOLOGY  I personally reviewed all radiographic images ordered to evaluate for the above acute complaints and reviewed radiology reports and findings.  These findings were personally discussed with the patient.  Please see medical record for radiology report.  ____________________________________________   PROCEDURES  Procedure(s) performed:    Procedures    Critical Care performed: no ____________________________________________   INITIAL IMPRESSION / ASSESSMENT AND PLAN / ED COURSE  Pertinent labs & imaging results that were available during my care of the patient were reviewed by me and considered in my medical decision making (see chart for details).  DDX: dehydration, uti, aki, chf, sepsis, pcm  Anna Lara is a 83 y.o. who presents to the ED with chief complaint of generalized weakness and urinary frequency as described above. She is afebrile and hemodynamic stable but very cachectic and dehydrated appearing. Urinalysis does show evidence of probable UTI patient has evidence of acute on chronic hypothyroidism. Patient also with mild hyponatremia and prerenal azotemia. Patient will be given IV fluids. She is restarted on levothyroxine. Patient given IV Rocephin. This pointed  to fill patient will require admission for further evaluation and management.      ____________________________________________   FINAL CLINICAL IMPRESSION(S) / ED DIAGNOSES  Final diagnoses:  Complicated UTI (urinary tract infection)  Hypothyroidism, unspecified type  Dehydration      NEW MEDICATIONS STARTED DURING THIS VISIT:  New Prescriptions   No medications on file     Note:  This document was prepared using Dragon voice recognition software and may include unintentional dictation errors.    Merlyn Lot, MD 09/28/16 2129

## 2016-09-28 NOTE — ED Triage Notes (Signed)
Pt to ED via POV. PT states that she has had generalized weakness since last night. Pt states that she was seen by PCP on Thursday and told that she was dehydrated. Pt does not appear to be in any distress at this time.

## 2016-09-28 NOTE — Progress Notes (Signed)
Chaplain received and responded to OR for patient. Visited with patient, daughter, and husband. Provided AD education. Confirmed already having a living will for healthcare in place.    09/28/16 1755  Clinical Encounter Type  Visited With Patient;Patient and family together  Visit Type Initial;Other (Comment)  Referral From Nurse  Consult/Referral To Chaplain  Spiritual Encounters  Spiritual Needs Other (Comment)

## 2016-09-28 NOTE — H&P (Signed)
Anna Lara is an 83 y.o. female.   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 She's undergone radiation therapy. She is unable to take oral intake and has a PEG tube in place. She has been feeling weak for the past day. She had recently undergone cataract surgery. Here in the ER she's found to be hyponatremic, hypokalemic and possibly have a mild urinary tract infection. Because she was unable to take oral intake hospital service was called to admit her for observation.  Past Medical History:  Diagnosis Date  . Heart murmur   . Hypertension   . Thyroid disease   . Vertigo     Past Surgical History:  Procedure Laterality Date  . GASTROSTOMY TUBE PLACEMENT      No family history on file. Social History:  reports that she has never smoked. She has never used smokeless tobacco. She reports that she does not drink alcohol or use drugs.  Allergies: No Known Allergies   (Not in a hospital admission)  Results for orders placed or performed during the hospital encounter of 09/28/16 (from the past 48 hour(s))  Basic metabolic panel     Status: Abnormal   Collection Time: 09/28/16 11:00 AM  Result Value Ref Range   Sodium 131 (L) 135 - 145 mmol/L   Potassium 3.0 (L) 3.5 - 5.1 mmol/L   Chloride 95 (L) 101 - 111 mmol/L   CO2 28 22 - 32 mmol/L   Glucose, Bld 121 (H) 65 - 99 mg/dL   BUN 64 (H) 6 - 20 mg/dL   Creatinine, Ser 1.33 (H) 0.44 - 1.00 mg/dL   Calcium 8.6 (L) 8.9 - 10.3 mg/dL   GFR calc non Af Amer 36 (L) >60 mL/min   GFR calc Af Amer 42 (L) >60 mL/min    Comment: (NOTE) The eGFR has been calculated using the CKD EPI equation. This calculation has not been validated in all clinical situations. eGFR's persistently <60 mL/min signify possible Chronic Kidney Disease.    Anion gap 8 5 - 15  CBC     Status: Abnormal   Collection Time: 09/28/16 11:00 AM  Result Value Ref Range   WBC 4.8 3.6 - 11.0 K/uL   RBC 4.10 3.80  - 5.20 MIL/uL   Hemoglobin 10.5 (L) 12.0 - 16.0 g/dL   HCT 32.5 (L) 35.0 - 47.0 %   MCV 79.4 (L) 80.0 - 100.0 fL   MCH 25.6 (L) 26.0 - 34.0 pg   MCHC 32.3 32.0 - 36.0 g/dL   RDW 17.0 (H) 11.5 - 14.5 %   Platelets 148 (L) 150 - 440 K/uL  Urinalysis, Complete w Microscopic     Status: Abnormal   Collection Time: 09/28/16 11:00 AM  Result Value Ref Range   Color, Urine YELLOW (A) YELLOW   APPearance HAZY (A) CLEAR   Specific Gravity, Urine 1.009 1.005 - 1.030   pH 6.0 5.0 - 8.0   Glucose, UA NEGATIVE NEGATIVE mg/dL   Hgb urine dipstick NEGATIVE NEGATIVE   Bilirubin Urine NEGATIVE NEGATIVE   Ketones, ur NEGATIVE NEGATIVE mg/dL   Protein, ur NEGATIVE NEGATIVE mg/dL   Nitrite NEGATIVE NEGATIVE   Leukocytes, UA LARGE (A) NEGATIVE   RBC / HPF 0-5 0 - 5 RBC/hpf   WBC, UA 6-30 0 - 5 WBC/hpf   Bacteria, UA FEW (A) NONE SEEN   Squamous Epithelial / LPF 0-5 (A) NONE SEEN  TSH     Status: Abnormal  Collection Time: 09/28/16 11:00 AM  Result Value Ref Range   TSH 16.317 (H) 0.350 - 4.500 uIU/mL    Comment: Performed by a 3rd Generation assay with a functional sensitivity of <=0.01 uIU/mL.  Troponin I     Status: Abnormal   Collection Time: 09/28/16 11:00 AM  Result Value Ref Range   Troponin I 0.03 (HH) <0.03 ng/mL    Comment: CRITICAL RESULT CALLED TO, READ BACK BY AND VERIFIED WITH FELICIA STAROPOLI ON 6/43/32 AT 1316 [facility]    Dg Chest 2 View  Result Date: 09/28/2016 CLINICAL DATA:  Generalized weakness since last night, question pneumonia or CHF; history hypertension EXAM: CHEST  2 VIEW COMPARISON:  None FINDINGS: Enlargement of cardiac silhouette. Atherosclerotic calcification aorta. Mediastinal contours and pulmonary vascularity normal. Lungs emphysematous consistent with COPD. No acute infiltrate, pleural effusion or pneumothorax. Bones diffusely demineralized. IMPRESSION: Enlargement of cardiac silhouette. COPD changes without acute infiltrate. Aortic Atherosclerosis (ICD10-I70.0) and  Emphysema (ICD10-J43.9). Electronically Signed   By: Lavonia Dana M.D.   On: 09/28/2016 12:54    Review of Systems  Constitutional: Negative for fever.  HENT: Negative for hearing loss.   Eyes: Negative for blurred vision.  Respiratory: Negative for cough.   Cardiovascular: Negative for chest pain.  Gastrointestinal: Negative for nausea and vomiting.  Genitourinary: Negative for dysuria.  Skin: Negative for rash.  Neurological: Negative for dizziness.    Blood pressure (!) 150/80, pulse 67, temperature 98.1 F (36.7 C), temperature source Oral, resp. rate 16, height 5' 4"  (1.626 m), weight 47.6 kg (105 lb), SpO2 99 %. Physical Exam  Constitutional: She is oriented to person, place, and time.  Cachectic looking female in no acute distress  HENT:  Head: Normocephalic and atraumatic.  Oral mucosa dry  Eyes: Pupils are equal, round, and reactive to light. No scleral icterus.  Neck: Neck supple. No JVD present. No thyromegaly present.  Cardiovascular: Normal rate.   Murmur heard. Respiratory: Effort normal and breath sounds normal. No respiratory distress. She has no wheezes. She exhibits no tenderness.  GI: Soft. Bowel sounds are normal. She exhibits no distension and no mass.  PEG tube in place  Musculoskeletal: Normal range of motion. She exhibits no edema.  Lymphadenopathy:    She has no cervical adenopathy.  Neurological: She is alert and oriented to person, place, and time.  Skin: Skin is warm and dry.     Assessment/Plan 1. Dehydration. She does do 4-5 cans of ensure a day. Continue much water intake she does. Denies any fever. We'll go ahead and give her IV fluids along with her usual PEG tube intake. We'll get nutrition consult to assess calorie needs. 2. Urinary tract infection. We'll give Rocephin. Urine culture pending 3. Hyponatremia. Secondary to dehydration. We'll give normal saline 4. Hypo-kalemia. We'll put potassium in IV fluids. 5. Hypothyroidism. She is on  thyroid replacement but her TSH is elevated. She did say she recently saw her primary care physician and was given a new prescription. Recommended following up with her primary care. 6. Protein malnutrition. Again will get nutrition consult.  Total time spent 30 minutes  Baxter Hire, MD 09/28/2016, 4:33 PM

## 2016-09-29 LAB — BASIC METABOLIC PANEL
Anion gap: 4 — ABNORMAL LOW (ref 5–15)
BUN: 51 mg/dL — ABNORMAL HIGH (ref 6–20)
CALCIUM: 8.1 mg/dL — AB (ref 8.9–10.3)
CHLORIDE: 106 mmol/L (ref 101–111)
CO2: 25 mmol/L (ref 22–32)
Creatinine, Ser: 1.02 mg/dL — ABNORMAL HIGH (ref 0.44–1.00)
GFR calc non Af Amer: 50 mL/min — ABNORMAL LOW (ref >60)
GFR, EST AFRICAN AMERICAN: 58 mL/min — AB (ref >60)
Glucose, Bld: 106 mg/dL — ABNORMAL HIGH (ref 65–99)
Potassium: 4.7 mmol/L (ref 3.5–5.1)
SODIUM: 135 mmol/L (ref 135–145)

## 2016-09-29 MED ORDER — CEPHALEXIN 250 MG PO CAPS: 250.0000 mg | ORAL_CAPSULE | Freq: Three times a day (TID) | ORAL | 0 refills | Status: AC

## 2016-09-29 MED ORDER — CEPHALEXIN 250 MG PO CAPS: 250.0000 mg | ORAL_CAPSULE | Freq: Three times a day (TID) | ORAL | 0 refills | Status: DC

## 2016-09-29 MED ORDER — ENSURE ENLIVE PO LIQD: 237.0000 mL | Freq: Four times a day (QID) | ORAL | 12 refills | Status: DC

## 2016-09-29 MED ORDER — CHLORTHALIDONE 25 MG PO TABS: 12.5000 mg | ORAL_TABLET | Freq: Every day | ORAL | 1 refills | Status: DC

## 2016-09-29 NOTE — Care Management Obs Status (Signed)
[facility] NOTIFICATION   Patient Details  Name: AMIEL SHARROW MRN: [id number] Date of Birth: 03/07/1934   Medicare Observation Status Notification Given:   (moon letter not given, D/C in less than 24 hours)    , A, RN 09/29/2016, 10:10 AM

## 2016-09-29 NOTE — Progress Notes (Signed)
Pt reports feeling "much better" and ready to go home. Husband and family at bedside. Oral and written AVS instructions given. Pt's pharmacy is closed today with MD paged with request for written rx for cephalexin which was obtained and given to pt/family. Questions answered to their satisfaction.

## 2016-09-29 NOTE — Progress Notes (Signed)
Pt transported in transport chair to private vehicle with discharge home to self care/husband/family. Discharge home.

## 2016-09-29 NOTE — Progress Notes (Signed)
Dietitian making referral to [facility] with pt/family aware.

## 2016-09-29 NOTE — Progress Notes (Signed)
Initial Nutrition Assessment  DOCUMENTATION CODES:   Severe malnutrition in context of chronic illness  INTERVENTION:  Recommend providing Ensure Plus 5 times daily per PEG tube. Provides 1750 kcal (100% estimated kcal needs), 65 grams protein (88% minimum estimated protein needs), 900 ml H2O.  Recommend addition of ProMod 30 ml daily to meet patient's protein needs (100 kcal, 10 grams of protein per 30 ml). Will discuss recommendation with RD from [facility].  Recommend free water flush of 60 ml before and after each tube feeding bolus. Provides 1500 ml water daily in addition to water in tube feeding.  Discussed with patient ways to monitor hydration status at home.  NUTRITION DIAGNOSIS:   Malnutrition (Severe) related to chronic illness (hx pharyngeal cancer s/p XRT, dysphagia) as evidenced by severe depletion of body fat, severe depletion of muscle mass.  GOAL:   Patient will meet greater than or equal to 90% of their needs  MONITOR:   Labs, Weight trends, TF tolerance, I & O's  REASON FOR ASSESSMENT:   Consult Assessment of nutrition requirement/status  ASSESSMENT:   83 year old female with PMHx of vertigo, HTN, hypothyroidism, heart murmur, hx of pharyngeal cancer s/p XRT, dysphagia s/p PEG tube placement who presented with dehydration, UTI, hyponatremia, hypokalemia.   Spoke with patient at bedside. She reports she has had her PEG tube for the past 17 years. She does not take any food/beverages PO due to risk of aspiration. When her mouth is dry, she does swish water in her mouth and spit it out. Patient reports her TF regimen is Ensure Plus 4-5 times daily per tube (4 bottles: 1400 kcal, 52 grams protein, 720 ml H2O; 5 bottles: 1750 kcal, 65 grams protein, 900 ml H2O). She has been following this regimen for at least 10 years and was originally prescribed by [facility]. She does not currently follow with a dietitian outpatient. She provides 60 ml free water flush before  and after each bottle of Ensure Plus and then provides an additional 6 cups of water throughout the day per tube. Patient and daughter are concerned about dehydration as patient reports she tends to sweat a lot. Patient denies any N/V, abdominal pain, constipation/diarrhea.  Patient reports UBW 115-120 lbs. Endorses weight loss but is not sure of time frame. Per chart patient was 117 lbs on 10/01/2015 and has lost 8.8 lbs (7.5% body weight) over the past 10 months, which is not significant for time frame.   Access: PEG tube present on admission   Tube Feeding: ordered for Ensure Enlive QID per tube  Medications reviewed and include: ferrous sulfate 30 mg daily, levothyroxine, potassium chloride 40 mEq BID, NS with KCl 20 mEq/L @ 100 ml/hr, ceftriaxone.  Labs reviewed: BUN 51, Creatinine 1.02, Anion gap 4.   Nutrition-Focused physical exam completed. Findings are severe fat depletion, severe muscle depletion, and no edema. Patient is edentulous (no dentures needed as she is NPO due to dysphagia).   Discussed with RN.  Diet Order:  Diet - low sodium heart healthy  Skin:  Reviewed, no issues  Last BM:  PTA (09/27/2016 per chart)  Height:   Ht Readings from Last 1 Encounters:  09/28/16 5\' 4"  (1.626 m)    Weight:   Wt Readings from Last 1 Encounters:  09/28/16 108 lb 3.2 oz (49.1 kg)    Ideal Body Weight:  54.5 kg  BMI:  Body mass index is 18.57 kg/m.  Estimated Nutritional Needs:   Kcal:  1475-1720 (30-35 kcal/kg)  Protein:  74-84 grams (1.5-1.7 grams/kg)  Fluid:  1.2 L/day (25 ml/kg)  EDUCATION NEEDS:   Education needs addressed  Willey Blade, MS, RD, LDN Pager: [phone] After Hours Pager: [phone]

## 2016-09-29 NOTE — Discharge Summary (Signed)
[facility] [place] NAME: Anna Lara    MR#:  [id number]  DATE OF BIRTH:  01-21-1934  DATE OF ADMISSION:  09/28/2016   ADMITTING PHYSICIAN: Baxter Hire, MD  DATE OF DISCHARGE: 09/29/2016  PRIMARY CARE PHYSICIAN: Marguerita Merles, MD   ADMISSION DIAGNOSIS:   Dehydration [I77.8] Complicated UTI (urinary tract infection) [N39.0] Hypothyroidism, unspecified type [E03.9]  DISCHARGE DIAGNOSIS:   Active Problems:   Dehydration   SECONDARY DIAGNOSIS:   Past Medical History:  Diagnosis Date  . Heart murmur   . Hypertension   . Thyroid disease   . Vertigo     HOSPITAL COURSE:   83 year old female with past medical history significant for throat cancer status post radiation, GERD, hypothyroidism, hypertension presents to the hospital secondary to weakness.  #1 weakness-secondary to dehydration. Improved with IV fluids  #2 acute renal failure-prerenal causes, renal function has normalized with fluids.  #3 throat cancer status post radiation-with dysphagia, has a PEG tube. Continue tube feeds and ensure supplements as prior.  #4 hypertension-decreased the dose of chlorthalidone, continue lisinopril and verapamil.  #5 hypokalemia-being replaced.  Patient feels strong and will be discharged today  DISCHARGE CONDITIONS:   Stable CONSULTS OBTAINED:    none  DRUG ALLERGIES:   No Known Allergies DISCHARGE MEDICATIONS:   Allergies as of 09/29/2016   No Known Allergies     Medication List    TAKE these medications   aspirin EC 81 MG tablet Take 81 mg by mouth daily.   cephALEXin 250 MG capsule Commonly known as:  KEFLEX Take 1 capsule (250 mg total) by mouth 3 (three) times daily.   chlorthalidone 25 MG tablet Commonly known as:  HYGROTON Take 0.5 tablets (12.5 mg total) by mouth daily. What changed:  how much to take   feeding supplement (ENSURE ENLIVE) Liqd Place 237 mLs into feeding tube 4 (four)  times daily.   Iron 15 MG/1.5ML Susp Take 3 mLs (30 mg total) by mouth 2 (two) times daily. Take every other day. What changed:  when to take this  additional instructions   levothyroxine 100 MCG tablet Commonly known as:  SYNTHROID, LEVOTHROID Take 100 mcg by mouth every morning.   lisinopril 40 MG tablet Commonly known as:  PRINIVIL,ZESTRIL Take 40 mg by mouth every morning.   verapamil 40 MG tablet Commonly known as:  CALAN Take 40 mg by mouth daily.        DISCHARGE INSTRUCTIONS:   1. PCP follow-up in 1-2 weeks  DIET:   Cardiac diet  ACTIVITY:   Activity as tolerated  OXYGEN:   Home Oxygen: No.  Oxygen Delivery: room air  DISCHARGE LOCATION:   home   If you experience worsening of your admission symptoms, develop shortness of breath, life threatening emergency, suicidal or homicidal thoughts you must seek medical attention immediately by calling 911 or calling your MD immediately  if symptoms less severe.  You Must read complete instructions/literature along with all the possible adverse reactions/side effects for all the Medicines you take and that have been prescribed to you. Take any new Medicines after you have completely understood and accpet all the possible adverse reactions/side effects.   Please note  You were cared for by a hospitalist during your hospital stay. If you have any questions about your discharge medications or the care you received while you were in the hospital after you are discharged, you can call the unit and asked to speak  with the hospitalist on call if the hospitalist that took care of you is not available. Once you are discharged, your primary care physician will handle any further medical issues. Please note that NO REFILLS for any discharge medications will be authorized once you are discharged, as it is imperative that you return to your primary care physician (or establish a relationship with a primary care physician if you  do not have one) for your aftercare needs so that they can reassess your need for medications and monitor your lab values.    On the day of Discharge:  VITAL SIGNS:   Blood pressure (!) 113/57, pulse 69, temperature 97.9 F (36.6 C), temperature source Oral, resp. rate 20, height 5\' 4"  (1.626 m), weight 49.1 kg (108 lb 3.2 oz), SpO2 100 %.  PHYSICAL EXAMINATION:    GENERAL:  83 y.o.-year-old Thin built patient lying in the bed with no acute distress.  EYES: Pupils equal, round, reactive to light and accommodation. No scleral icterus. Extraocular muscles intact.  HEENT: Head atraumatic, normocephalic. Oropharynx and nasopharynx clear.  NECK:  Supple, no jugular venous distention. No thyroid enlargement, no tenderness.  LUNGS: Normal breath sounds bilaterally, no wheezing, rales,rhonchi or crepitation. No use of accessory muscles of respiration.  CARDIOVASCULAR: S1, S2 normal. No murmurs, rubs, or gallops.  ABDOMEN: Soft, non-tender, non-distended. Bowel sounds present. No organomegaly or mass. Has a PEG tube in place EXTREMITIES: No pedal edema, cyanosis, or clubbing.  NEUROLOGIC: Cranial nerves II through XII are intact. Muscle strength 5/5 in all extremities. Sensation intact. Gait not checked.  PSYCHIATRIC: The patient is alert and oriented x 3.  SKIN: No obvious rash, lesion, or ulcer.   DATA REVIEW:   CBC  Recent Labs Lab 09/28/16 1100  WBC 4.8  HGB 10.5*  HCT 32.5*  PLT 148*    Chemistries   Recent Labs Lab 09/29/16 0550  NA 135  K 4.7  CL 106  CO2 25  GLUCOSE 106*  BUN 51*  CREATININE 1.02*  CALCIUM 8.1*     Microbiology Results  No results found for this or any previous visit.  RADIOLOGY:  Dg Chest 2 View  Result Date: 09/28/2016 CLINICAL DATA:  Generalized weakness since last night, question pneumonia or CHF; history hypertension EXAM: CHEST  2 VIEW COMPARISON:  None FINDINGS: Enlargement of cardiac silhouette. Atherosclerotic calcification aorta.  Mediastinal contours and pulmonary vascularity normal. Lungs emphysematous consistent with COPD. No acute infiltrate, pleural effusion or pneumothorax. Bones diffusely demineralized. IMPRESSION: Enlargement of cardiac silhouette. COPD changes without acute infiltrate. Aortic Atherosclerosis (ICD10-I70.0) and Emphysema (ICD10-J43.9). Electronically Signed   By: Lavonia Dana M.D.   On: 09/28/2016 12:54     Management plans discussed with the patient, family and they are in agreement.  CODE STATUS:     Code Status Orders        Start     Ordered   09/28/16 1734  Full code  Continuous     09/28/16 1733    Code Status History    Date Active Date Inactive Code Status Order ID Comments User Context   This patient has a current code status but no historical code status.    Advance Directive Documentation     Most Recent Value  Type of Advance Directive  Living will  Pre-existing out of facility DNR order (yellow form or pink MOST form)  -  "MOST" Form in Place?  -      TOTAL TIME TAKING CARE OF THIS PATIENT: 14  minutes.    Gladstone Lighter M.D on 09/29/2016 at 9:06 AM  Between 7am to 6pm - Pager - [phone]  After 6pm go to [email or web address] - Proofreader  [facility]  Office  [phone]  CC: Primary care physician; Marguerita Merles, MD   Note: This dictation was prepared with Dragon dictation along with smaller phrase technology. Any transcriptional errors that result from this process are unintentional.

## 2016-09-30 LAB — T3, FREE: T3, Free: 2 pg/mL (ref 2.0–4.4)

## 2016-10-01 LAB — URINE CULTURE

## 2017-04-08 ENCOUNTER — Other Ambulatory Visit: Payer: Self-pay

## 2017-04-08 ENCOUNTER — Emergency Department
Admission: EM | Admit: 2017-04-08 | Discharge: 2017-04-08 | Disposition: A | Discharge status: Home / Self Care | Payer: Medicare Other | Attending: Emergency Medicine | Admitting: Emergency Medicine

## 2017-04-08 ENCOUNTER — Encounter: Payer: Self-pay | Admitting: Emergency Medicine

## 2017-04-08 ENCOUNTER — Emergency Department: Payer: Medicare Other

## 2017-04-08 DIAGNOSIS — Z79899 Other long term (current) drug therapy: Secondary | ICD-10-CM | POA: Diagnosis not present

## 2017-04-08 DIAGNOSIS — Z7982 Long term (current) use of aspirin: Secondary | ICD-10-CM | POA: Diagnosis not present

## 2017-04-08 DIAGNOSIS — E860 Dehydration: Secondary | ICD-10-CM | POA: Insufficient documentation

## 2017-04-08 DIAGNOSIS — R0789 Other chest pain: Secondary | ICD-10-CM | POA: Insufficient documentation

## 2017-04-08 DIAGNOSIS — Z85818 Personal history of malignant neoplasm of other sites of lip, oral cavity, and pharynx: Secondary | ICD-10-CM | POA: Insufficient documentation

## 2017-04-08 DIAGNOSIS — Z931 Gastrostomy status: Secondary | ICD-10-CM | POA: Diagnosis not present

## 2017-04-08 DIAGNOSIS — R079 Chest pain, unspecified: Secondary | ICD-10-CM

## 2017-04-08 DIAGNOSIS — I10 Essential (primary) hypertension: Secondary | ICD-10-CM | POA: Diagnosis not present

## 2017-04-08 HISTORY — DX: Malignant (primary) neoplasm, unspecified: C80.1

## 2017-04-08 LAB — COMPREHENSIVE METABOLIC PANEL
ALT: 80 U/L — ABNORMAL HIGH (ref 14–54)
AST: 97 U/L — AB (ref 15–41)
Albumin: 3.5 g/dL (ref 3.5–5.0)
Alkaline Phosphatase: 63 U/L (ref 38–126)
Anion gap: 11 (ref 5–15)
BILIRUBIN TOTAL: 0.7 mg/dL (ref 0.3–1.2)
BUN: 134 mg/dL — AB (ref 6–20)
CHLORIDE: 107 mmol/L (ref 101–111)
CO2: 23 mmol/L (ref 22–32)
Calcium: 8.8 mg/dL — ABNORMAL LOW (ref 8.9–10.3)
Creatinine, Ser: 2.15 mg/dL — ABNORMAL HIGH (ref 0.44–1.00)
GFR, EST AFRICAN AMERICAN: 24 mL/min — AB (ref >60)
GFR, EST NON AFRICAN AMERICAN: 20 mL/min — AB (ref >60)
Glucose, Bld: 136 mg/dL — ABNORMAL HIGH (ref 65–99)
Potassium: 3.5 mmol/L (ref 3.5–5.1)
Sodium: 141 mmol/L (ref 135–145)
TOTAL PROTEIN: 7 g/dL (ref 6.5–8.1)

## 2017-04-08 LAB — TROPONIN I

## 2017-04-08 LAB — CBC
HCT: 34.6 % — ABNORMAL LOW (ref 35.0–47.0)
Hemoglobin: 11.1 g/dL — ABNORMAL LOW (ref 12.0–16.0)
MCH: 28.4 pg (ref 26.0–34.0)
MCHC: 32.1 g/dL (ref 32.0–36.0)
MCV: 88.4 fL (ref 80.0–100.0)
PLATELETS: 154 10*3/uL (ref 150–440)
RBC: 3.92 MIL/uL (ref 3.80–5.20)
RDW: 14.5 % (ref 11.5–14.5)
WBC: 10.6 10*3/uL (ref 3.6–11.0)

## 2017-04-08 MED ORDER — SODIUM CHLORIDE 0.9 % IV SOLN
1000.0000 mL | Freq: Once | INTRAVENOUS | Status: AC
  Administered 2017-04-08: 1000 mL via INTRAVENOUS

## 2017-04-08 NOTE — ED Triage Notes (Signed)
C/O SOB that started last night.  Also c/o feeling weak.  Also Chest tightness.

## 2017-04-08 NOTE — ED Provider Notes (Signed)
[facility] Emergency Department Provider Note   ____________________________________________    I have reviewed the triage vital signs and the nursing notes.   HISTORY  Chief Complaint Chest tightness    HPI Anna Lara is a 84 y.o. female who presents with complaints of chest tightness.  Patient was visiting a family member in the hospital yesterday and became quite anxious.  Later in the day she developed a tightness in the center of her chest which she states comes and goes.  She denies shortness of breath.  No fevers or chills or cough.  No calf pain or swelling.  No lower extremity swelling.  No nausea or vomiting or diaphoresis.  She reports she has a history of a murmur for which she sees a cardiologist.  Denies a history of heart attacks.  Does not smoke   Past Medical History:  Diagnosis Date  . Cancer (HCC)    throat cancer  . Heart murmur   . Hypertension   . Thyroid disease   . Vertigo     Patient Active Problem List   Diagnosis Date Noted  . Dehydration 09/28/2016    Past Surgical History:  Procedure Laterality Date  . GASTROSTOMY TUBE PLACEMENT      Prior to Admission medications   Medication Sig Start Date End Date Taking? Authorizing Provider  aspirin EC 81 MG tablet Take 81 mg by mouth daily.    Yes [provider]  chlorthalidone (HYGROTON) 25 MG tablet Take 0.5 tablets (12.5 mg total) by mouth daily. 09/29/16  Yes Gladstone Lighter, MD  feeding supplement, ENSURE ENLIVE, (ENSURE ENLIVE) LIQD Place 237 mLs into feeding tube 4 (four) times daily. 09/29/16  Yes Gladstone Lighter, MD  Iron 15 MG/1.5ML SUSP Take 3 mLs (30 mg total) by mouth 2 (two) times daily. Take every other day. Patient taking differently: Take 30 mg by mouth daily.  05/25/16  Yes Merlyn Lot, MD  levothyroxine (SYNTHROID, LEVOTHROID) 100 MCG tablet Take 100 mcg by mouth every morning.    Yes [provider]  lisinopril  (PRINIVIL,ZESTRIL) 40 MG tablet Take 40 mg by mouth every morning.    Yes [provider]  verapamil (CALAN) 40 MG tablet Take 40 mg by mouth daily.    Yes [provider]     Allergies Patient has no known allergies.  No family history on file.  Social History Social History   Tobacco Use  . Smoking status: Never Smoker  . Smokeless tobacco: Never Used  Substance Use Topics  . Alcohol use: No  . Drug use: No    Review of Systems  Constitutional: No fever/chills Eyes: No visual changes.  ENT: No sore throat. Cardiovascular: As above Respiratory: "Breathing is good " Gastrointestinal: No abdominal pain.  No nausea, no vomiting.   Genitourinary: Negative for dysuria. Musculoskeletal: Negative for back pain. Skin: Negative for rash. Neurological: Negative for headaches   ____________________________________________   PHYSICAL EXAM:  VITAL SIGNS: ED Triage Vitals  Enc Vitals Group     BP 04/08/17 0807 106/69     Pulse Rate 04/08/17 0807 78     Resp 04/08/17 0807 18     Temp 04/08/17 0807 97.8 F (36.6 C)     Temp Source 04/08/17 0807 Oral     SpO2 04/08/17 0807 100 %     Weight 04/08/17 0808 45.4 kg (100 lb)     Height 04/08/17 0808 1.6 m (5\' 3" )     Head  Circumference --      Peak Flow --      Pain Score 04/08/17 0811 0     Pain Loc --      Pain Edu? --      Excl. in [place]? --     Constitutional: Alert and oriented. No acute distress. Pleasant and interactive Eyes: Conjunctivae are normal.   Nose: No congestion/rhinnorhea. Mouth/Throat: Mucous membranes are moist.    Cardiovascular: Normal rate, regular rhythm.  Systolic ejection murmur. good peripheral circulation. Respiratory: Normal respiratory effort.  No retractions. Lungs CTAB. Gastrointestinal: Soft and nontender. No distention.  No CVA tenderness.  G-tube CDI Genitourinary: deferred Musculoskeletal:  Warm and well perfused Neurologic:  Normal speech and language. No gross focal  neurologic deficits are appreciated.  Skin:  Skin is warm, dry and intact. No rash noted. Psychiatric: Mood and affect are normal. Speech and behavior are normal.  ____________________________________________   LABS (all labs ordered are listed, but only abnormal results are displayed)  Labs Reviewed  CBC - Abnormal; Notable for the following components:      Result Value   Hemoglobin 11.1 (*)    HCT 34.6 (*)    All other components within normal limits  COMPREHENSIVE METABOLIC PANEL - Abnormal; Notable for the following components:   Glucose, Bld 136 (*)    BUN 134 (*)    Creatinine, Ser 2.15 (*)    Calcium 8.8 (*)    AST 97 (*)    ALT 80 (*)    GFR calc non Af Amer 20 (*)    GFR calc Af Amer 24 (*)    All other components within normal limits  TROPONIN I   ____________________________________________  EKG  ED ECG REPORT I, Lavonia Drafts, the attending physician, personally viewed and interpreted this ECG.  Date: 04/08/2017  Rhythm: normal sinus rhythm QRS Axis: normal Intervals: normal ST/T Wave abnormalities: Nonspecific changes  Unchanged from prior EKG ____________________________________________  RADIOLOGY  Chest x-ray shows chronic bronchitic changes ____________________________________________   PROCEDURES  Procedure(s) performed: No  Procedures   Critical Care performed:No ____________________________________________   INITIAL IMPRESSION / ASSESSMENT AND PLAN / ED COURSE  Pertinent labs & imaging results that were available during my care of the patient were reviewed by me and considered in my medical decision making (see chart for details).  Patient overall well-appearing in no acute distress.  EKG unchanged from prior.  Pending labs including troponin.  Differential diagnosis includes stress reaction, gastritis, ACS  ----------------------------------------- 10:16 AM on 04/08/2017 -----------------------------------------  Patient  remains well-appearing in no distress, no chest pain.  Troponin is normal.  However patient does have an elevated creatinine with an elevated BUN.  Daughter feels this is because patient receives food/liquid via G-tube and because of a family member being hospitalized their schedule has been off this week.  I offered hospitalization however the patient and daughter would prefer to do a liter of fluid here and then follow-up with her PCP as they report this has worked in the past.  Was initially reticent to do this given elevated BUN and creatinine however I do think it is a reasonable approach as they would strongly prefer to avoid hospitalization.      ____________________________________________   FINAL CLINICAL IMPRESSION(S) / ED DIAGNOSES  Final diagnoses:  Dehydration  Chest pain, unspecified type        Note:  This document was prepared using Dragon voice recognition software and may include unintentional dictation errors.    Lavonia Drafts,  MD 04/08/17 1205

## 2017-04-08 NOTE — ED Notes (Signed)
First Nurse Note:  Patient ambulatory to STAT desk with complaint of weakness and tightness in chest.  Placed in [place].  Son at side.  Skin warm and dry.  Alert and oriented.

## 2017-04-08 NOTE — Discharge Instructions (Signed)
Please be sure to recheck kidney functions with your PCP to ensure that dehydration is improving.

## 2019-05-06 ENCOUNTER — Encounter: Payer: Self-pay | Admitting: Emergency Medicine

## 2019-05-06 ENCOUNTER — Emergency Department
Admission: EM | Admit: 2019-05-06 | Discharge: 2019-05-06 | Disposition: A | Discharge status: Home / Self Care | Payer: Medicare Other | Attending: Emergency Medicine | Admitting: Emergency Medicine

## 2019-05-06 DIAGNOSIS — Z85818 Personal history of malignant neoplasm of other sites of lip, oral cavity, and pharynx: Secondary | ICD-10-CM | POA: Diagnosis not present

## 2019-05-06 DIAGNOSIS — R110 Nausea: Secondary | ICD-10-CM | POA: Diagnosis not present

## 2019-05-06 DIAGNOSIS — R531 Weakness: Secondary | ICD-10-CM | POA: Diagnosis present

## 2019-05-06 DIAGNOSIS — I10 Essential (primary) hypertension: Secondary | ICD-10-CM | POA: Insufficient documentation

## 2019-05-06 DIAGNOSIS — N390 Urinary tract infection, site not specified: Secondary | ICD-10-CM | POA: Insufficient documentation

## 2019-05-06 LAB — CBC WITH DIFFERENTIAL/PLATELET
Abs Immature Granulocytes: 0.04 10*3/uL (ref 0.00–0.07)
Basophils Absolute: 0 10*3/uL (ref 0.0–0.1)
Basophils Relative: 1 %
Eosinophils Absolute: 0.1 10*3/uL (ref 0.0–0.5)
Eosinophils Relative: 2 %
HCT: 40.2 % (ref 36.0–46.0)
Hemoglobin: 12.7 g/dL (ref 12.0–15.0)
Immature Granulocytes: 1 %
Lymphocytes Relative: 27 %
Lymphs Abs: 2.1 10*3/uL (ref 0.7–4.0)
MCH: 27.9 pg (ref 26.0–34.0)
MCHC: 31.6 g/dL (ref 30.0–36.0)
MCV: 88.4 fL (ref 80.0–100.0)
Monocytes Absolute: 0.6 10*3/uL (ref 0.1–1.0)
Monocytes Relative: 8 %
Neutro Abs: 4.7 10*3/uL (ref 1.7–7.7)
Neutrophils Relative %: 61 %
Platelets: 244 10*3/uL (ref 150–400)
RBC: 4.55 MIL/uL (ref 3.87–5.11)
RDW: 13.5 % (ref 11.5–15.5)
WBC: 7.5 10*3/uL (ref 4.0–10.5)
nRBC: 0 % (ref 0.0–0.2)

## 2019-05-06 LAB — COMPREHENSIVE METABOLIC PANEL
ALT: 33 U/L (ref 0–44)
AST: 51 U/L — ABNORMAL HIGH (ref 15–41)
Albumin: 3.5 g/dL (ref 3.5–5.0)
Alkaline Phosphatase: 122 U/L (ref 38–126)
Anion gap: 13 (ref 5–15)
BUN: 27 mg/dL — ABNORMAL HIGH (ref 8–23)
CO2: 26 mmol/L (ref 22–32)
Calcium: 9.2 mg/dL (ref 8.9–10.3)
Chloride: 94 mmol/L — ABNORMAL LOW (ref 98–111)
Creatinine, Ser: 1.07 mg/dL — ABNORMAL HIGH (ref 0.44–1.00)
GFR calc Af Amer: 56 mL/min — ABNORMAL LOW (ref >60)
GFR calc non Af Amer: 48 mL/min — ABNORMAL LOW (ref >60)
Glucose, Bld: 255 mg/dL — ABNORMAL HIGH (ref 70–99)
Potassium: 3.9 mmol/L (ref 3.5–5.1)
Sodium: 133 mmol/L — ABNORMAL LOW (ref 135–145)
Total Bilirubin: 0.5 mg/dL (ref 0.3–1.2)
Total Protein: 7.1 g/dL (ref 6.5–8.1)

## 2019-05-06 LAB — URINALYSIS, COMPLETE (UACMP) WITH MICROSCOPIC
Bilirubin Urine: NEGATIVE
Glucose, UA: 50 mg/dL — AB
Hgb urine dipstick: NEGATIVE
Ketones, ur: NEGATIVE mg/dL
Nitrite: NEGATIVE
Protein, ur: NEGATIVE mg/dL
Specific Gravity, Urine: 1.009 (ref 1.005–1.030)
pH: 7 (ref 5.0–8.0)

## 2019-05-06 LAB — TROPONIN I (HIGH SENSITIVITY): Troponin I (High Sensitivity): 11 ng/L (ref <18)

## 2019-05-06 MED ORDER — CEPHALEXIN 250 MG PO CAPS: 250.0000 mg | ORAL_CAPSULE | Freq: Four times a day (QID) | ORAL | 0 refills | Status: AC

## 2019-05-06 MED ORDER — SODIUM CHLORIDE 0.9 % IV SOLN
1000.0000 mL | Freq: Once | INTRAVENOUS | Status: AC
  Administered 2019-05-06: 21:00:00 1000 mL via INTRAVENOUS

## 2019-05-06 NOTE — ED Notes (Signed)
Discussed discharge instructions with daughter and patient

## 2019-05-06 NOTE — ED Triage Notes (Signed)
Pt arrived with family who report that pt has had increased weakness x1 hour. Pt sts she is nauseous as well. Pt has all feedings through peg tube. Pt denies emesis, fever, SOB and chest pain.

## 2019-05-06 NOTE — ED Provider Notes (Signed)
[facility] Emergency Department Provider Note       Time seen: ----------------------------------------- 8:29 PM on 05/06/2019 -----------------------------------------   I have reviewed the triage vital signs and the nursing notes.  HISTORY   Chief Complaint No chief complaint on file.    HPI Anna Lara is a 86 y.o. female with a history of throat cancer, heart murmur, hypertension, thyroid disease, vertigo who presents to the ED for increased weakness over the last hour, she reports she has been nauseous as well.  Patient states she does not feel well.  She denies any recent illness, denies any change in her medications.  She takes all of her feedings through PEG tube.  Denies fever, vomiting, shortness of breath or chest pain.  Past Medical History:  Diagnosis Date  . Cancer (HCC)    throat cancer  . Heart murmur   . Hypertension   . Thyroid disease   . Vertigo     Patient Active Problem List   Diagnosis Date Noted  . Dehydration 09/28/2016    Past Surgical History:  Procedure Laterality Date  . GASTROSTOMY TUBE PLACEMENT      Allergies Patient has no known allergies.  Social History Social History   Tobacco Use  . Smoking status: Never Smoker  . Smokeless tobacco: Never Used  Substance Use Topics  . Alcohol use: No  . Drug use: No    Review of Systems Constitutional: Negative for fever. Cardiovascular: Negative for chest pain. Respiratory: Negative for shortness of breath. Gastrointestinal: Negative for abdominal pain, positive for nausea Musculoskeletal: Negative for back pain. Skin: Negative for rash. Neurological: Positive for weakness  All systems negative/normal/unremarkable except as stated in the HPI  ____________________________________________   PHYSICAL EXAM:  VITAL SIGNS: ED Triage Vitals  Enc Vitals Group     BP      Pulse      Resp      Temp      Temp src      SpO2      Weight    Height      Head Circumference      Peak Flow      Pain Score      Pain Loc      Pain Edu?      Excl. in [place]?     Constitutional: Alert and oriented.  Anxious, mild distress Eyes: Conjunctivae are normal. Normal extraocular movements. ENT      Head: Normocephalic and atraumatic.      Nose: No congestion/rhinnorhea.      Mouth/Throat: Mucous membranes are moist.      Neck: No stridor. Cardiovascular: Normal rate, regular rhythm. No murmurs, rubs, or gallops. Respiratory: Normal respiratory effort without tachypnea nor retractions. Breath sounds are clear and equal bilaterally. No wheezes/rales/rhonchi. Gastrointestinal: Soft and nontender. Normal bowel sounds Musculoskeletal: Nontender with normal range of motion in extremities. No lower extremity tenderness nor edema. Neurologic:  Normal speech and language. No gross focal neurologic deficits are appreciated.  Skin:  Skin is warm, dry and intact. No rash noted. Psychiatric: Anxious mood and affect ____________________________________________  EKG: Interpreted by me.  Sinus rhythm the rate is 74 bpm, first-degree AV block, LVH with repolarization abnormality.  EKG unchanged from prior  ____________________________________________  ED COURSE:  As part of my medical decision making, I reviewed the following data within the [place] History obtained from family if available, nursing notes, old chart and ekg, as well as notes from prior  ED visits. Patient presented for weakness, we will assess with labs and imaging as indicated at this time. Clinical Course as of May 05 2234  Thu May 06, 2019  2156 Patient reports she is feeling better after fluids.   [JW]    Clinical Course User Index [JW] Earleen Newport, MD   Procedures  Anna Lara was evaluated in Emergency Department on 05/06/2019 for the symptoms described in the history of present illness. She was evaluated in the context of the global  COVID-19 pandemic, which necessitated consideration that the patient might be at risk for infection with the SARS-CoV-2 virus that causes COVID-19. Institutional protocols and algorithms that pertain to the evaluation of patients at risk for COVID-19 are in a state of rapid change based on information released by regulatory bodies including the CDC and federal and state organizations. These policies and algorithms were followed during the patient's care in the ED.  ____________________________________________   LABS (pertinent positives/negatives)  Labs Reviewed  COMPREHENSIVE METABOLIC PANEL - Abnormal; Notable for the following components:      Result Value   Sodium 133 (*)    Chloride 94 (*)    Glucose, Bld 255 (*)    BUN 27 (*)    Creatinine, Ser 1.07 (*)    AST 51 (*)    GFR calc non Af Amer 48 (*)    GFR calc Af Amer 56 (*)    All other components within normal limits  URINALYSIS, COMPLETE (UACMP) WITH MICROSCOPIC - Abnormal; Notable for the following components:   Color, Urine YELLOW (*)    APPearance HAZY (*)    Glucose, UA 50 (*)    Leukocytes,Ua TRACE (*)    Bacteria, UA MANY (*)    All other components within normal limits  CBC WITH DIFFERENTIAL/PLATELET  CBG MONITORING, ED  TROPONIN I (HIGH SENSITIVITY)   ____________________________________________   DIFFERENTIAL DIAGNOSIS   Dehydration, electrolyte abnormality, anxiety, occult infection  FINAL ASSESSMENT AND PLAN  Weakness, urinary tract infection   Plan: The patient had presented for weakness. Patient's labs revealed a likely urinary tract infection and perhaps dehydration for which she was given IV fluids.  As dictated above she is feeling better, has no complaints currently.  She be discharged with Keflex for UTI.   Laurence Aly, MD    Note: This note was generated in part or whole with voice recognition software. Voice recognition is usually quite accurate but there are transcription errors that  can and very often do occur. I apologize for any typographical errors that were not detected and corrected.     Earleen Newport, MD 05/06/19 2237

## 2019-05-06 NOTE — ED Notes (Signed)
Daughter updated by phone.

## 2019-08-03 ENCOUNTER — Emergency Department
Admission: EM | Admit: 2019-08-03 | Discharge: 2019-08-03 | Discharge status: Against Medical Advice | Payer: Medicare Other

## 2019-08-03 NOTE — ED Notes (Signed)
Pt observed leaving by registration at front desk. pts daughter reported to registration that they would go elsewhere.

## 2019-08-13 ENCOUNTER — Other Ambulatory Visit: Payer: Self-pay | Admitting: Family Medicine

## 2019-08-13 DIAGNOSIS — R221 Localized swelling, mass and lump, neck: Secondary | ICD-10-CM

## 2019-08-25 ENCOUNTER — Other Ambulatory Visit: Payer: Self-pay

## 2019-08-25 ENCOUNTER — Ambulatory Visit
Admission: RE | Admit: 2019-08-25 | Discharge: 2019-08-25 | Disposition: A | Discharge status: Home / Self Care | Payer: Medicare Other | Source: Ambulatory Visit | Attending: Family Medicine | Admitting: Family Medicine

## 2019-08-25 DIAGNOSIS — R221 Localized swelling, mass and lump, neck: Secondary | ICD-10-CM

## 2021-05-13 ENCOUNTER — Emergency Department
Admission: EM | Admit: 2021-05-13 | Discharge: 2021-05-14 | Disposition: A | Discharge status: Home / Self Care | Payer: Medicare Other | Attending: Emergency Medicine | Admitting: Emergency Medicine

## 2021-05-13 ENCOUNTER — Encounter: Payer: Self-pay | Admitting: Emergency Medicine

## 2021-05-13 ENCOUNTER — Other Ambulatory Visit: Payer: Self-pay

## 2021-05-13 DIAGNOSIS — R531 Weakness: Secondary | ICD-10-CM

## 2021-05-13 DIAGNOSIS — Z20822 Contact with and (suspected) exposure to covid-19: Secondary | ICD-10-CM | POA: Diagnosis not present

## 2021-05-13 DIAGNOSIS — N390 Urinary tract infection, site not specified: Secondary | ICD-10-CM | POA: Diagnosis not present

## 2021-05-13 LAB — HEPATIC FUNCTION PANEL
ALT: 36 U/L (ref 0–44)
AST: 40 U/L (ref 15–41)
Albumin: 3.1 g/dL — ABNORMAL LOW (ref 3.5–5.0)
Alkaline Phosphatase: 80 U/L (ref 38–126)
Bilirubin, Direct: 0.1 mg/dL (ref 0.0–0.2)
Indirect Bilirubin: 0.4 mg/dL (ref 0.3–0.9)
Total Bilirubin: 0.5 mg/dL (ref 0.3–1.2)
Total Protein: 6.3 g/dL — ABNORMAL LOW (ref 6.5–8.1)

## 2021-05-13 LAB — URINALYSIS, ROUTINE W REFLEX MICROSCOPIC
Bilirubin Urine: NEGATIVE
Glucose, UA: NEGATIVE mg/dL
Hgb urine dipstick: NEGATIVE
Ketones, ur: NEGATIVE mg/dL
Nitrite: NEGATIVE
Protein, ur: NEGATIVE mg/dL
Specific Gravity, Urine: 1.017 (ref 1.005–1.030)
pH: 6 (ref 5.0–8.0)

## 2021-05-13 LAB — CBC WITH DIFFERENTIAL/PLATELET
Abs Immature Granulocytes: 0.06 10*3/uL (ref 0.00–0.07)
Basophils Absolute: 0 10*3/uL (ref 0.0–0.1)
Basophils Relative: 0 %
Eosinophils Absolute: 0 10*3/uL (ref 0.0–0.5)
Eosinophils Relative: 0 %
HCT: 37.2 % (ref 36.0–46.0)
Hemoglobin: 11.7 g/dL — ABNORMAL LOW (ref 12.0–15.0)
Immature Granulocytes: 1 %
Lymphocytes Relative: 12 %
Lymphs Abs: 1.2 10*3/uL (ref 0.7–4.0)
MCH: 27.1 pg (ref 26.0–34.0)
MCHC: 31.5 g/dL (ref 30.0–36.0)
MCV: 86.1 fL (ref 80.0–100.0)
Monocytes Absolute: 0.9 10*3/uL (ref 0.1–1.0)
Monocytes Relative: 9 %
Neutro Abs: 8 10*3/uL — ABNORMAL HIGH (ref 1.7–7.7)
Neutrophils Relative %: 78 %
Platelets: 130 10*3/uL — ABNORMAL LOW (ref 150–400)
RBC: 4.32 MIL/uL (ref 3.87–5.11)
RDW: 14.5 % (ref 11.5–15.5)
WBC: 10.2 10*3/uL (ref 4.0–10.5)
nRBC: 0 % (ref 0.0–0.2)

## 2021-05-13 LAB — RESP PANEL BY RT-PCR (RSV, FLU A&B, COVID)  RVPGX2
Influenza A by PCR: NEGATIVE
Influenza B by PCR: NEGATIVE
Resp Syncytial Virus by PCR: NEGATIVE
SARS Coronavirus 2 by RT PCR: NEGATIVE

## 2021-05-13 LAB — CBC
HCT: 35.9 % — ABNORMAL LOW (ref 36.0–46.0)
Hemoglobin: 11.3 g/dL — ABNORMAL LOW (ref 12.0–15.0)
MCH: 27.3 pg (ref 26.0–34.0)
MCHC: 31.5 g/dL (ref 30.0–36.0)
MCV: 86.7 fL (ref 80.0–100.0)
Platelets: 130 10*3/uL — ABNORMAL LOW (ref 150–400)
RBC: 4.14 MIL/uL (ref 3.87–5.11)
RDW: 14.6 % (ref 11.5–15.5)
WBC: 10.8 10*3/uL — ABNORMAL HIGH (ref 4.0–10.5)
nRBC: 0 % (ref 0.0–0.2)

## 2021-05-13 LAB — BASIC METABOLIC PANEL
Anion gap: 10 (ref 5–15)
BUN: 36 mg/dL — ABNORMAL HIGH (ref 8–23)
CO2: 26 mmol/L (ref 22–32)
Calcium: 8.2 mg/dL — ABNORMAL LOW (ref 8.9–10.3)
Chloride: 97 mmol/L — ABNORMAL LOW (ref 98–111)
Creatinine, Ser: 1.27 mg/dL — ABNORMAL HIGH (ref 0.44–1.00)
GFR, Estimated: 41 mL/min — ABNORMAL LOW (ref >60)
Glucose, Bld: 228 mg/dL — ABNORMAL HIGH (ref 70–99)
Potassium: 3.8 mmol/L (ref 3.5–5.1)
Sodium: 133 mmol/L — ABNORMAL LOW (ref 135–145)

## 2021-05-13 LAB — LACTIC ACID, PLASMA
Lactic Acid, Venous: 1.9 mmol/L (ref 0.5–1.9)
Lactic Acid, Venous: 0.9 mmol/L (ref 0.5–1.9)

## 2021-05-13 LAB — TROPONIN I (HIGH SENSITIVITY)
Troponin I (High Sensitivity): 27 ng/L — ABNORMAL HIGH (ref <18)
Troponin I (High Sensitivity): 25 ng/L — ABNORMAL HIGH (ref <18)

## 2021-05-13 MED ORDER — CEPHALEXIN 500 MG PO CAPS
500.0000 mg | ORAL_CAPSULE | Freq: Once | ORAL | Status: AC
  Administered 2021-05-14: 500 mg via ORAL
  Filled 2021-05-13: qty 1

## 2021-05-13 MED ORDER — SODIUM CHLORIDE 0.9 % IV BOLUS
1000.0000 mL | Freq: Once | INTRAVENOUS | Status: AC
Start: 2021-05-13 — End: 2021-05-13
  Administered 2021-05-13: 1000 mL via INTRAVENOUS

## 2021-05-13 MED ORDER — CEPHALEXIN 500 MG PO CAPS: 500.0000 mg | ORAL_CAPSULE | Freq: Three times a day (TID) | ORAL | 0 refills | Status: AC

## 2021-05-13 MED ORDER — LACTATED RINGERS IV BOLUS
1000.0000 mL | Freq: Once | INTRAVENOUS | Status: AC
  Administered 2021-05-13: 1000 mL via INTRAVENOUS

## 2021-05-13 NOTE — ED Provider Notes (Signed)
[facility] Provider Note    Event Date/Time   First MD Initiated Contact with Patient 05/13/21 1728     (approximate)   History   Weakness and Emesis   HPI  Anna Lara is a 88 y.o. female who comes in complaining of some weakness and dizziness.  She had 1 episode of diarrhea and began having symptoms after that.  She has had this 1 time previously and went away spontaneously.      Physical Exam   Triage Vital Signs: ED Triage Vitals  Enc Vitals Group     BP 05/13/21 1607 (!) 148/66     Pulse Rate 05/13/21 1607 94     Resp 05/13/21 1607 18     Temp 05/13/21 1607 98.7 F (37.1 C)     Temp Source 05/13/21 1607 Oral     SpO2 05/13/21 1607 92 %     Weight 05/13/21 1605 112 lb (50.8 kg)     Height 05/13/21 1605 5\' 4"  (1.626 m)     Head Circumference --      Peak Flow --      Pain Score 05/13/21 1604 0     Pain Loc --      Pain Edu? --      Excl. in [place]? --     Most recent vital signs: Vitals:   05/13/21 2211 05/13/21 2230  BP: (!) 155/76 (!) 149/81  Pulse: 99 94  Resp: (!) 26 (!) 24  Temp:    SpO2: 99% 98%     General: Awake, no distress.  CV:  Good peripheral perfusion.  Resp:  Normal effort.  Lungs are clear Abd:  No distention.  Dominant soft and nontender    ED Results / Procedures / Treatments   Labs (all labs ordered are listed, but only abnormal results are displayed) Labs Reviewed  BASIC METABOLIC PANEL - Abnormal; Notable for the following components:      Result Value   Sodium 133 (*)    Chloride 97 (*)    Glucose, Bld 228 (*)    BUN 36 (*)    Creatinine, Ser 1.27 (*)    Calcium 8.2 (*)    GFR, Estimated 41 (*)    All other components within normal limits  CBC - Abnormal; Notable for the following components:   WBC 10.8 (*)    Hemoglobin 11.3 (*)    HCT 35.9 (*)    Platelets 130 (*)    All other components within normal limits  URINALYSIS, ROUTINE W REFLEX MICROSCOPIC - Abnormal; Notable for the  following components:   Color, Urine AMBER (*)    APPearance CLOUDY (*)    Leukocytes,Ua MODERATE (*)    Bacteria, UA MANY (*)    All other components within normal limits  HEPATIC FUNCTION PANEL - Abnormal; Notable for the following components:   Total Protein 6.3 (*)    Albumin 3.1 (*)    All other components within normal limits  CBC WITH DIFFERENTIAL/PLATELET - Abnormal; Notable for the following components:   Hemoglobin 11.7 (*)    Platelets 130 (*)    Neutro Abs 8.0 (*)    All other components within normal limits  TROPONIN I (HIGH SENSITIVITY) - Abnormal; Notable for the following components:   Troponin I (High Sensitivity) 25 (*)    All other components within normal limits  TROPONIN I (HIGH SENSITIVITY) - Abnormal; Notable for the following components:   Troponin I (High Sensitivity) 27 (*)  All other components within normal limits  RESP PANEL BY RT-PCR (RSV, FLU A&B, COVID)  [id number]  GASTROINTESTINAL PANEL BY PCR, STOOL (REPLACES STOOL CULTURE)  C DIFFICILE QUICK SCREEN W PCR REFLEX    LACTIC ACID, PLASMA  LACTIC ACID, PLASMA  CBG MONITORING, ED     EKG  EKG read interpreted by me shows normal sinus rhythm rate of 97 normal axis nonspecific ST-T wave changes resolution of flipped T seen on the last EKG 2 years ago.   RADIOLOGY    PROCEDURES:  Critical Care performed:   Procedures   MEDICATIONS ORDERED IN ED: Medications  cephALEXin (KEFLEX) capsule 500 mg (has no administration in time range)  lactated ringers bolus 1,000 mL (0 mLs Intravenous Stopped 05/13/21 2000)  sodium chloride 0.9 % bolus 1,000 mL (0 mLs Intravenous Stopped 05/13/21 2211)     IMPRESSION / MDM / ASSESSMENT AND PLAN / ED COURSE  I reviewed the triage vital signs and the nursing notes. Patient has noted no further vomiting or diarrhea here in the emergency department.  She feels much better after the fluid.  She is not running a fever.  She is no longer weak or lightheaded.  Her  lab work does not show any other problems her troponin is stable her CBC is good electrolytes are okay GFR initially was 41 but it has been much lower in the past even down to 20.  I will let her go.  She will return if she has any further problems.  I will give her some Keflex for the UTI.  I will have her follow-up with her doctor. The patient is on the cardiac monitor to evaluate for evidence of arrhythmia and/or significant heart rate changes. I did consider admission for this patient but she appears to have improved considerably by herself I do not believe she needs admission at this time.      FINAL CLINICAL IMPRESSION(S) / ED DIAGNOSES   Final diagnoses:  Weakness  Urinary tract infection without hematuria, site unspecified     Rx / DC Orders   ED Discharge Orders          Ordered    cephALEXin (KEFLEX) 500 MG capsule  3 times daily        05/13/21 2348             Note:  This document was prepared using Dragon voice recognition software and may include unintentional dictation errors.   Nena Polio, MD 05/13/21 [phone]

## 2021-05-13 NOTE — Discharge Instructions (Signed)
I am very happy that the diarrhea seems to have stopped.  The blood work and other tests that I have gotten back on you all look okay except for what looks like that you have a UTI.  This could cause the weakness and vomiting and possibly even the diarrhea.  I am going to give you some Keflex antibiotic pills 1 pill 3 times a day.  Please return if you get worse at all.  This includes a fever or vomiting or worsening diarrhea or weakness or any other problems.  Please follow-up with your regular doctor in the next week or so.

## 2021-05-13 NOTE — ED Triage Notes (Signed)
Pt via POV from home. Pt c/o weakness, dizziness, and diarrhea that started this AM after the episode of diarrhea. Pt has been having some nausea. Denies pain. Pt is A&OX4 and NAD.

## 2021-05-13 NOTE — ED Notes (Signed)
Assisted pt to the toilet

## 2021-05-13 NOTE — ED Notes (Signed)
Assisted pt to toilet 

## 2021-05-14 NOTE — ED Notes (Signed)
RN first encounter prior to discharge. Pt verbalized understanding of discharge instructions, prescription medications, and follow-up care instructions. Pt advised if symptoms worsen to return to ED.

## 2022-05-20 IMAGING — US US SOFT TISSUE HEAD/NECK
1 series · 14 of 23 positions shown · non-contrast
Comparison: None.

CLINICAL DATA: Left submandibular swelling

EXAM:
ULTRASOUND OF HEAD/NECK SOFT TISSUES
TECHNIQUE: Ultrasound examination of the head and neck soft tissues was
performed in the area of clinical concern.

[Series 1: us soft tissue head/neck · 0.07mm/px · 14 of 23 slices shown]
[im 1/23]
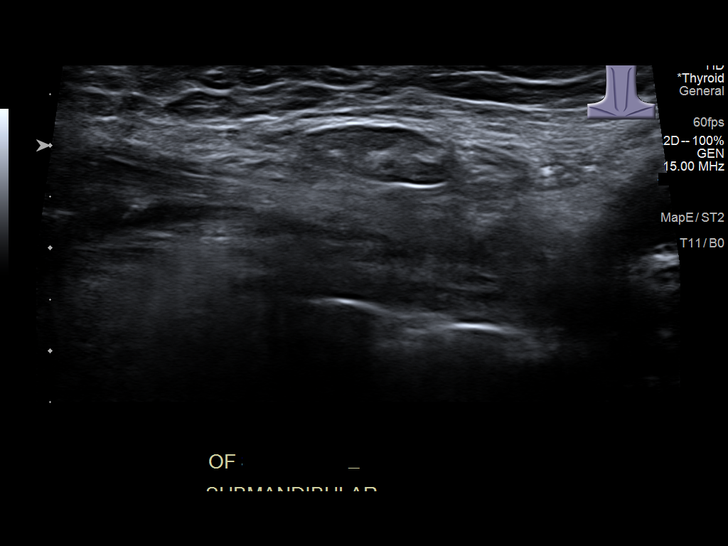
[im 3/23]
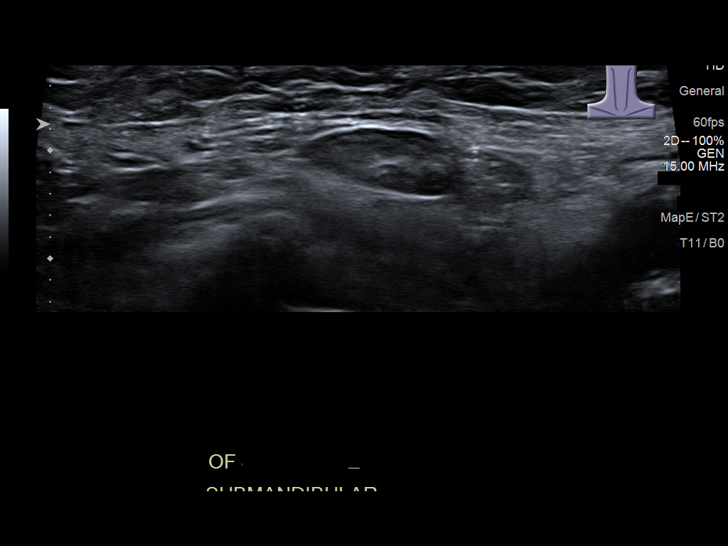
[im 5/23]
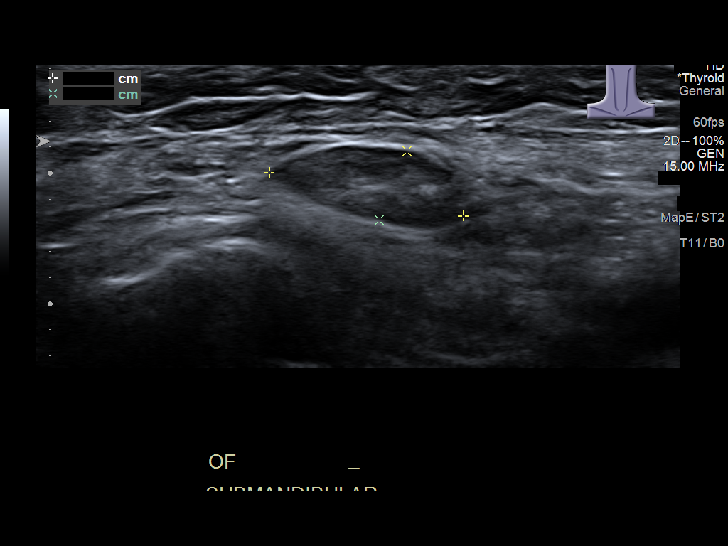
[im 6/23]
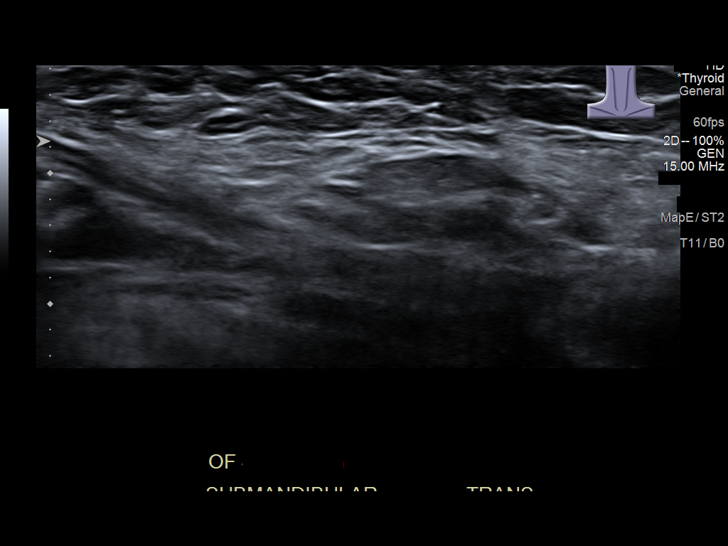
[im 8/23]
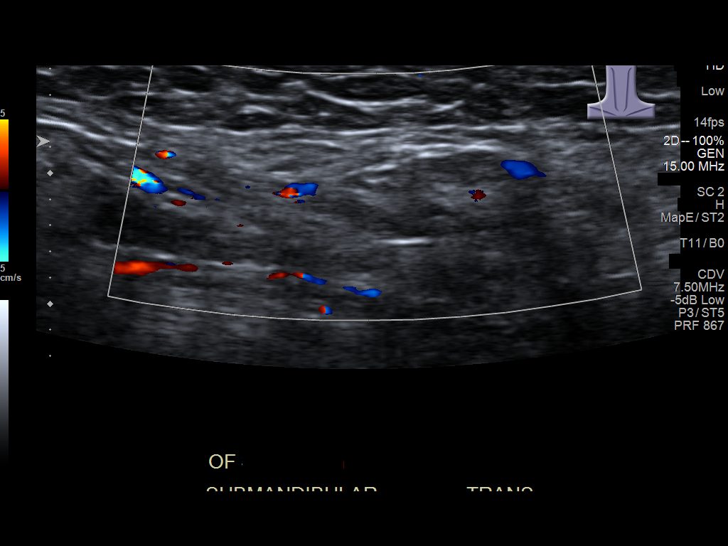
[im 10/23]
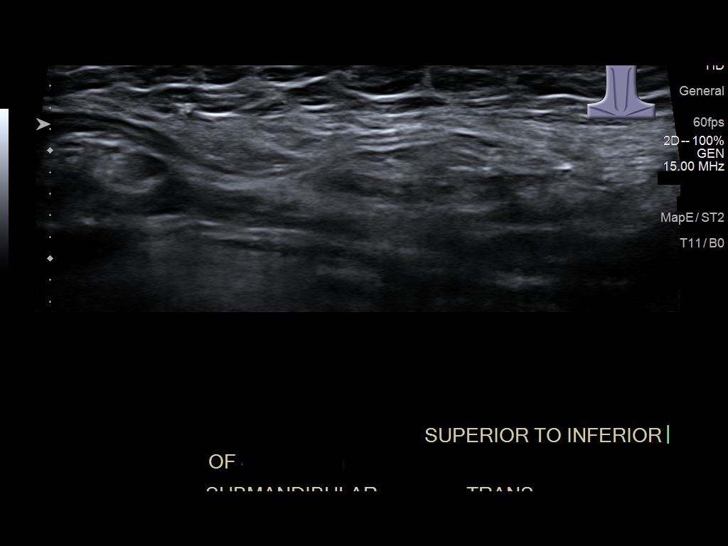
[im 11/23]
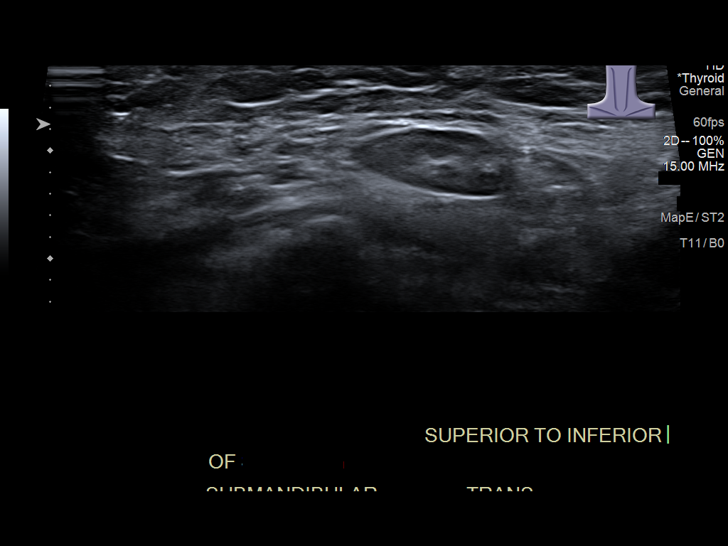
[im 13/23]
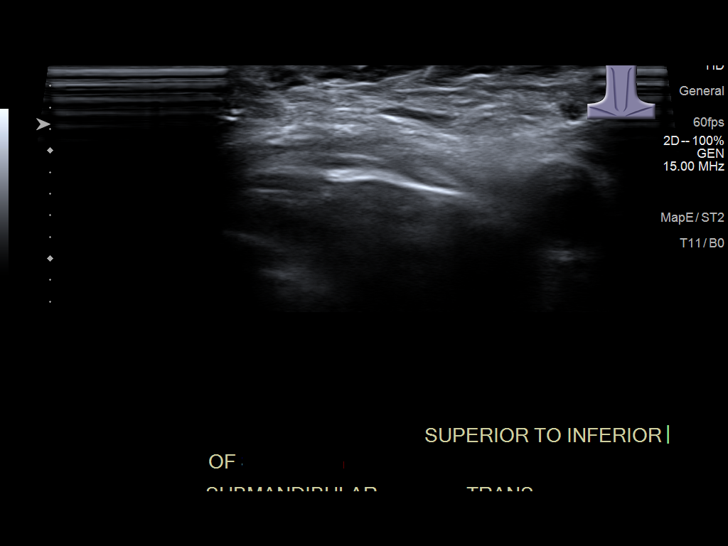
[im 14/23]
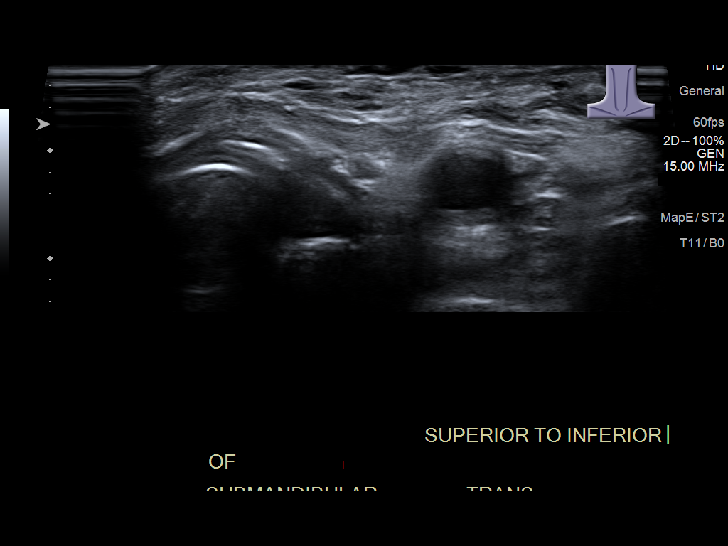
[im 16/23]
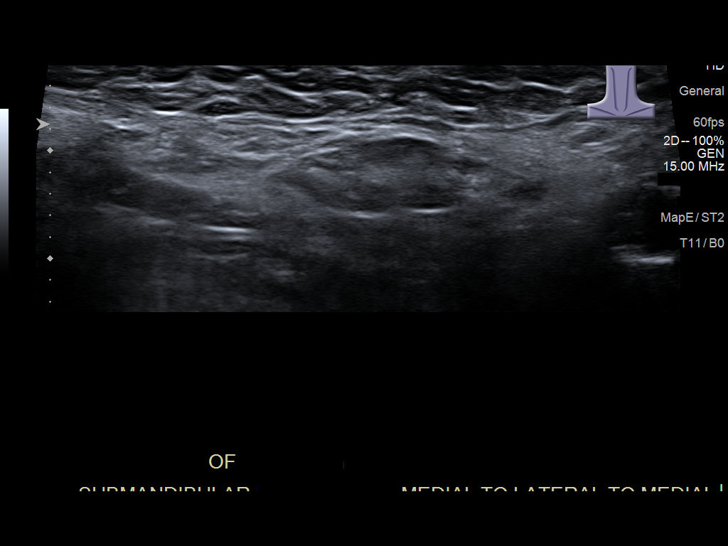
[im 18/23]
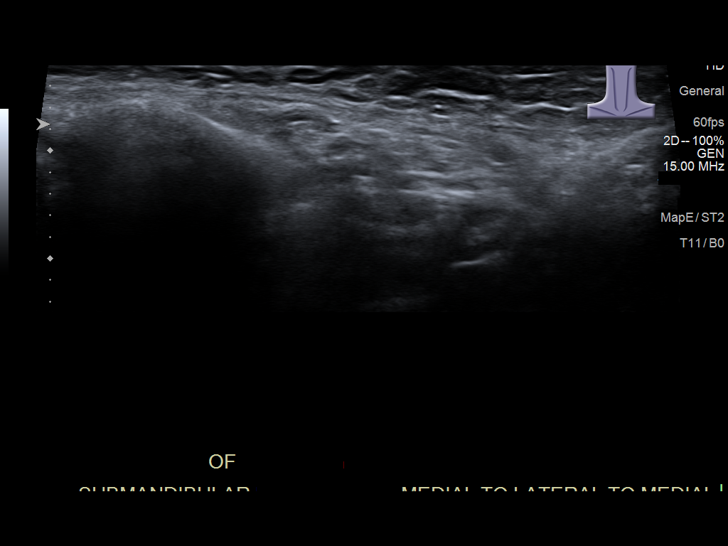
[im 19/23]
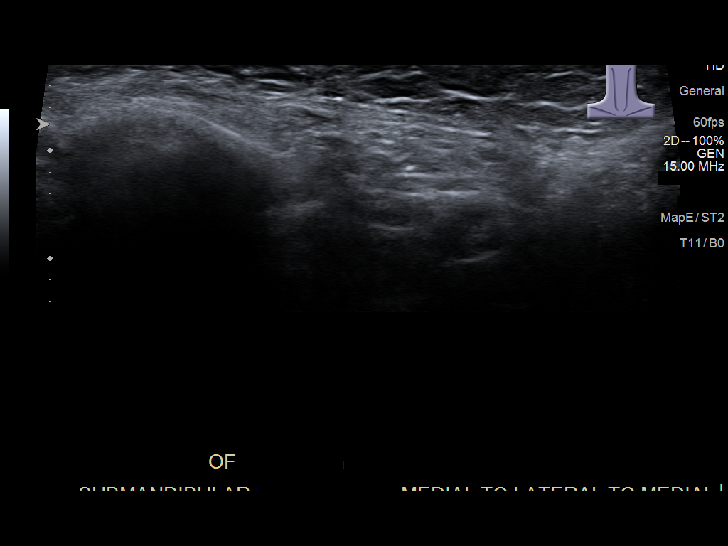
[im 21/23]
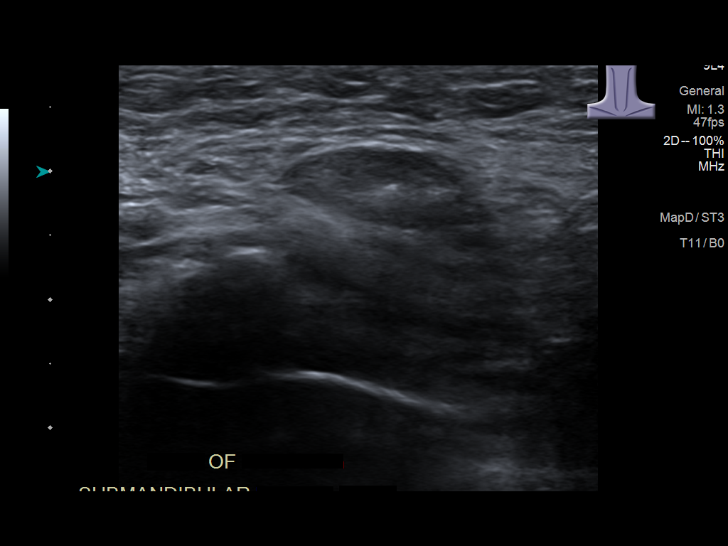
[im 23/23]
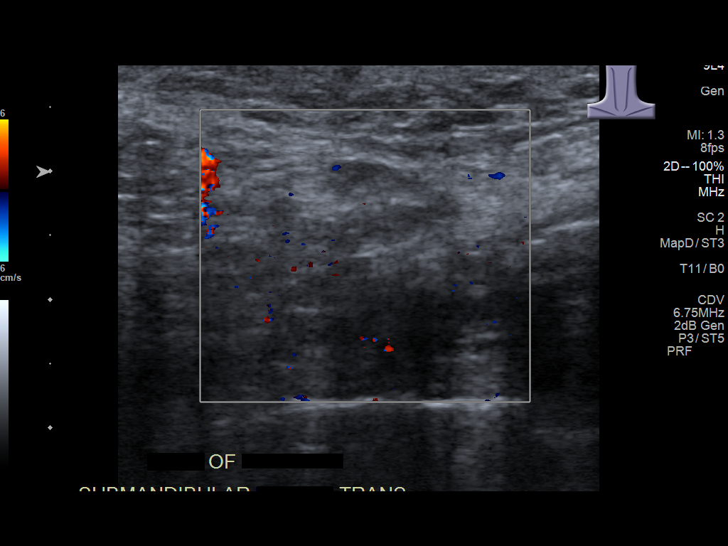

[14 of 23 positions shown; findings below may reference images not displayed]

FINDINGS: Superficial soft tissue ultrasound performed of the left
submandibular area of swelling. This correlates with a small
superficial benign-appearing lymph node measuring 1.5 cm in length
and 6 mm in short axis. Preserved hypoechoic cortex and fatty hila.
No other adjacent soft tissue abnormality, mass, cyst, fluid
collection, hemorrhage or hematoma. No abscess.
IMPRESSION: Left submandibular area of swelling or concern appears to correlate
with a benign-appearing superficial lymph node. No other significant
finding by ultrasound.

## 2022-10-23 ENCOUNTER — Emergency Department: Payer: Medicare Other

## 2022-10-23 ENCOUNTER — Observation Stay
Admission: EM | Admit: 2022-10-23 | Discharge: 2022-10-24 | Disposition: A | Discharge status: Home / Self Care | Payer: Medicare Other | Attending: Internal Medicine | Admitting: Internal Medicine

## 2022-10-23 ENCOUNTER — Observation Stay (HOSPITAL_BASED_OUTPATIENT_CLINIC_OR_DEPARTMENT_OTHER)
Admit: 2022-10-23 | Discharge: 2022-10-23 | Disposition: A | Discharge status: Home / Self Care | Payer: Medicare Other | Attending: Internal Medicine | Admitting: Internal Medicine

## 2022-10-23 ENCOUNTER — Other Ambulatory Visit: Payer: Self-pay

## 2022-10-23 DIAGNOSIS — M6281 Muscle weakness (generalized): Secondary | ICD-10-CM | POA: Insufficient documentation

## 2022-10-23 DIAGNOSIS — E1165 Type 2 diabetes mellitus with hyperglycemia: Secondary | ICD-10-CM | POA: Diagnosis not present

## 2022-10-23 DIAGNOSIS — I130 Hypertensive heart and chronic kidney disease with heart failure and stage 1 through stage 4 chronic kidney disease, or unspecified chronic kidney disease: Secondary | ICD-10-CM | POA: Insufficient documentation

## 2022-10-23 DIAGNOSIS — E039 Hypothyroidism, unspecified: Secondary | ICD-10-CM | POA: Diagnosis not present

## 2022-10-23 DIAGNOSIS — R112 Nausea with vomiting, unspecified: Secondary | ICD-10-CM | POA: Diagnosis present

## 2022-10-23 DIAGNOSIS — E1122 Type 2 diabetes mellitus with diabetic chronic kidney disease: Secondary | ICD-10-CM | POA: Insufficient documentation

## 2022-10-23 DIAGNOSIS — N1832 Chronic kidney disease, stage 3b: Secondary | ICD-10-CM | POA: Diagnosis not present

## 2022-10-23 DIAGNOSIS — R531 Weakness: Secondary | ICD-10-CM | POA: Insufficient documentation

## 2022-10-23 DIAGNOSIS — Z85818 Personal history of malignant neoplasm of other sites of lip, oral cavity, and pharynx: Secondary | ICD-10-CM | POA: Insufficient documentation

## 2022-10-23 DIAGNOSIS — D696 Thrombocytopenia, unspecified: Secondary | ICD-10-CM | POA: Insufficient documentation

## 2022-10-23 DIAGNOSIS — I340 Nonrheumatic mitral (valve) insufficiency: Secondary | ICD-10-CM

## 2022-10-23 DIAGNOSIS — E440 Moderate protein-calorie malnutrition: Secondary | ICD-10-CM | POA: Diagnosis not present

## 2022-10-23 DIAGNOSIS — B961 Klebsiella pneumoniae [K. pneumoniae] as the cause of diseases classified elsewhere: Secondary | ICD-10-CM | POA: Insufficient documentation

## 2022-10-23 DIAGNOSIS — A419 Sepsis, unspecified organism: Secondary | ICD-10-CM | POA: Insufficient documentation

## 2022-10-23 DIAGNOSIS — J1289 Other viral pneumonia: Principal | ICD-10-CM | POA: Insufficient documentation

## 2022-10-23 DIAGNOSIS — Z7982 Long term (current) use of aspirin: Secondary | ICD-10-CM | POA: Diagnosis not present

## 2022-10-23 DIAGNOSIS — U071 COVID-19: Secondary | ICD-10-CM | POA: Diagnosis present

## 2022-10-23 DIAGNOSIS — I5032 Chronic diastolic (congestive) heart failure: Secondary | ICD-10-CM | POA: Insufficient documentation

## 2022-10-23 DIAGNOSIS — R7989 Other specified abnormal findings of blood chemistry: Secondary | ICD-10-CM | POA: Insufficient documentation

## 2022-10-23 DIAGNOSIS — Z79899 Other long term (current) drug therapy: Secondary | ICD-10-CM | POA: Diagnosis not present

## 2022-10-23 LAB — COMPREHENSIVE METABOLIC PANEL
ALT: 21 U/L (ref 0–44)
AST: 40 U/L (ref 15–41)
Albumin: 3.3 g/dL — ABNORMAL LOW (ref 3.5–5.0)
Alkaline Phosphatase: 72 U/L (ref 38–126)
Anion gap: 10 (ref 5–15)
BUN: 28 mg/dL — ABNORMAL HIGH (ref 8–23)
CO2: 24 mmol/L (ref 22–32)
Calcium: 8.2 mg/dL — ABNORMAL LOW (ref 8.9–10.3)
Chloride: 99 mmol/L (ref 98–111)
Creatinine, Ser: 1.27 mg/dL — ABNORMAL HIGH (ref 0.44–1.00)
GFR, Estimated: 41 mL/min — ABNORMAL LOW (ref >60)
Glucose, Bld: 333 mg/dL — ABNORMAL HIGH (ref 70–99)
Potassium: 3.9 mmol/L (ref 3.5–5.1)
Sodium: 133 mmol/L — ABNORMAL LOW (ref 135–145)
Total Bilirubin: 0.1 mg/dL — ABNORMAL LOW (ref 0.3–1.2)
Total Protein: 7.1 g/dL (ref 6.5–8.1)

## 2022-10-23 LAB — RESP PANEL BY RT-PCR (FLU A&B, COVID) ARPGX2
Influenza A by PCR: NEGATIVE
Influenza B by PCR: NEGATIVE
SARS Coronavirus 2 by RT PCR: POSITIVE — AB

## 2022-10-23 LAB — CBC
HCT: 38.8 % (ref 36.0–46.0)
Hemoglobin: 12.5 g/dL (ref 12.0–15.0)
MCH: 27.4 pg (ref 26.0–34.0)
MCHC: 32.2 g/dL (ref 30.0–36.0)
MCV: 85.1 fL (ref 80.0–100.0)
Platelets: 149 10*3/uL — ABNORMAL LOW (ref 150–400)
RBC: 4.56 MIL/uL (ref 3.87–5.11)
RDW: 14.6 % (ref 11.5–15.5)
WBC: 4.1 10*3/uL (ref 4.0–10.5)
nRBC: 0 % (ref 0.0–0.2)

## 2022-10-23 LAB — URINALYSIS, W/ REFLEX TO CULTURE (INFECTION SUSPECTED)
Bilirubin Urine: NEGATIVE
Glucose, UA: 50 mg/dL — AB
Hgb urine dipstick: NEGATIVE
Ketones, ur: NEGATIVE mg/dL
Nitrite: NEGATIVE
Protein, ur: 30 mg/dL — AB
Specific Gravity, Urine: 1.015 (ref 1.005–1.030)
pH: 7 (ref 5.0–8.0)

## 2022-10-23 LAB — LACTIC ACID, PLASMA
Lactic Acid, Venous: 2.6 mmol/L (ref 0.5–1.9)
Lactic Acid, Venous: 1.2 mmol/L (ref 0.5–1.9)

## 2022-10-23 LAB — HEMOGLOBIN A1C
Hgb A1c MFr Bld: 5.9 % — ABNORMAL HIGH (ref 4.8–5.6)
Mean Plasma Glucose: 122.63 mg/dL

## 2022-10-23 LAB — ECHOCARDIOGRAM COMPLETE
Height: 64 in
Weight: 2014.12 oz

## 2022-10-23 LAB — PROTIME-INR
INR: 1.1 (ref 0.8–1.2)
Prothrombin Time: 14 seconds (ref 11.4–15.2)

## 2022-10-23 LAB — GLUCOSE, CAPILLARY
Glucose-Capillary: 121 mg/dL — ABNORMAL HIGH (ref 70–99)
Glucose-Capillary: 119 mg/dL — ABNORMAL HIGH (ref 70–99)
Glucose-Capillary: 107 mg/dL — ABNORMAL HIGH (ref 70–99)

## 2022-10-23 LAB — TROPONIN I (HIGH SENSITIVITY)
Troponin I (High Sensitivity): 83 ng/L — ABNORMAL HIGH (ref <18)
Troponin I (High Sensitivity): 80 ng/L — ABNORMAL HIGH (ref <18)

## 2022-10-23 LAB — D-DIMER, QUANTITATIVE: D-Dimer, Quant: 0.94 ug/mL-FEU — ABNORMAL HIGH (ref 0.00–0.50)

## 2022-10-23 LAB — LIPASE, BLOOD: Lipase: 51 U/L (ref 11–51)

## 2022-10-23 LAB — BRAIN NATRIURETIC PEPTIDE: B Natriuretic Peptide: 1370.1 pg/mL — ABNORMAL HIGH (ref 0.0–100.0)

## 2022-10-23 MED ORDER — ONDANSETRON HCL 4 MG/2ML IJ SOLN
4.0000 mg | Freq: Once | INTRAMUSCULAR | Status: AC
  Administered 2022-10-23: 4 mg via INTRAVENOUS
  Filled 2022-10-23: qty 2

## 2022-10-23 MED ORDER — OSMOLITE 1.2 CAL PO LIQD: 1000.0000 mL | ORAL | Status: DC

## 2022-10-23 MED ORDER — ENSURE ENLIVE PO LIQD
237.0000 mL | Freq: Three times a day (TID) | ORAL | Status: DC
  Administered 2022-10-23 – 2022-10-24 (×4): 237 mL

## 2022-10-23 MED ORDER — SODIUM CHLORIDE 0.9 % IV BOLUS
500.0000 mL | Freq: Once | INTRAVENOUS | Status: AC
  Administered 2022-10-23: 500 mL via INTRAVENOUS

## 2022-10-23 MED ORDER — ONDANSETRON HCL 4 MG PO TABS: 4.0000 mg | ORAL_TABLET | Freq: Three times a day (TID) | ORAL | Status: DC | PRN

## 2022-10-23 MED ORDER — AMLODIPINE BESYLATE 10 MG PO TABS: 10.0000 mg | ORAL_TABLET | Freq: Every day | ORAL | Status: DC

## 2022-10-23 MED ORDER — ACETAMINOPHEN 160 MG/5ML PO SOLN
650.0000 mg | Freq: Once | ORAL | Status: DC
  Filled 2022-10-23: qty 20.3

## 2022-10-23 MED ORDER — AMLODIPINE BESYLATE 10 MG PO TABS
10.0000 mg | ORAL_TABLET | Freq: Every day | ORAL | Status: DC
  Administered 2022-10-23 – 2022-10-24 (×2): 10 mg
  Filled 2022-10-23 (×2): qty 1

## 2022-10-23 MED ORDER — HYDRALAZINE HCL 20 MG/ML IJ SOLN: 5.0000 mg | Freq: Three times a day (TID) | INTRAMUSCULAR | Status: DC | PRN

## 2022-10-23 MED ORDER — ASPIRIN 81 MG PO TBEC: 81.0000 mg | DELAYED_RELEASE_TABLET | Freq: Every day | ORAL | Status: DC

## 2022-10-23 MED ORDER — LEVOTHYROXINE SODIUM 50 MCG PO TABS
125.0000 ug | ORAL_TABLET | Freq: Every day | ORAL | Status: DC
  Administered 2022-10-24: 125 ug
  Filled 2022-10-23: qty 1

## 2022-10-23 MED ORDER — SODIUM CHLORIDE 0.9 % IV SOLN
1.0000 g | Freq: Once | INTRAVENOUS | Status: AC
  Administered 2022-10-23: 1 g via INTRAVENOUS
  Filled 2022-10-23: qty 10

## 2022-10-23 MED ORDER — ENOXAPARIN SODIUM 30 MG/0.3ML IJ SOSY
30.0000 mg | PREFILLED_SYRINGE | INTRAMUSCULAR | Status: DC
  Administered 2022-10-23: 30 mg via SUBCUTANEOUS
  Filled 2022-10-23: qty 0.3

## 2022-10-23 MED ORDER — LOPERAMIDE HCL 2 MG PO CAPS: 2.0000 mg | ORAL_CAPSULE | ORAL | Status: DC | PRN

## 2022-10-23 MED ORDER — ONDANSETRON HCL 4 MG/2ML IJ SOLN
4.0000 mg | Freq: Four times a day (QID) | INTRAMUSCULAR | Status: DC | PRN
  Administered 2022-10-23: 4 mg via INTRAVENOUS
  Filled 2022-10-23: qty 2

## 2022-10-23 MED ORDER — ONDANSETRON HCL 4 MG PO TABS: 4.0000 mg | ORAL_TABLET | Freq: Four times a day (QID) | ORAL | Status: DC | PRN

## 2022-10-23 MED ORDER — LEVOTHYROXINE SODIUM 125 MCG PO TABS: 125.0000 ug | ORAL_TABLET | Freq: Every day | ORAL | Status: DC

## 2022-10-23 MED ORDER — METOPROLOL TARTRATE 25 MG PO TABS
12.5000 mg | ORAL_TABLET | Freq: Two times a day (BID) | ORAL | Status: DC
  Administered 2022-10-23 – 2022-10-24 (×3): 12.5 mg
  Filled 2022-10-23 (×3): qty 1

## 2022-10-23 MED ORDER — METOPROLOL SUCCINATE ER 25 MG PO TB24: 25.0000 mg | ORAL_TABLET | Freq: Every morning | ORAL | Status: DC

## 2022-10-23 MED ORDER — NIRMATRELVIR/RITONAVIR (PAXLOVID) TABLET (RENAL DOSING)
2.0000 | ORAL_TABLET | Freq: Two times a day (BID) | ORAL | Status: DC
  Administered 2022-10-23 – 2022-10-24 (×3): 2 via ORAL
  Filled 2022-10-23: qty 20

## 2022-10-23 MED ORDER — ASPIRIN 81 MG PO CHEW
81.0000 mg | CHEWABLE_TABLET | Freq: Every day | ORAL | Status: DC
  Administered 2022-10-23 – 2022-10-24 (×2): 81 mg
  Filled 2022-10-23 (×2): qty 1

## 2022-10-23 MED ORDER — METOPROLOL TARTRATE 5 MG/5ML IV SOLN
2.5000 mg | Freq: Once | INTRAVENOUS | Status: DC
  Filled 2022-10-23: qty 5

## 2022-10-23 MED ORDER — FREE WATER
200.0000 mL | Freq: Three times a day (TID) | Status: DC
  Administered 2022-10-23 – 2022-10-24 (×4): 200 mL

## 2022-10-23 MED ORDER — LOPERAMIDE HCL 1 MG/7.5ML PO SUSP: 2.0000 mg | ORAL | Status: DC | PRN

## 2022-10-23 MED ORDER — INSULIN ASPART 100 UNIT/ML IJ SOLN
0.0000 [IU] | Freq: Three times a day (TID) | INTRAMUSCULAR | Status: DC
  Filled 2022-10-23: qty 1

## 2022-10-23 MED ORDER — IOHEXOL 350 MG/ML SOLN
60.0000 mL | Freq: Once | INTRAVENOUS | Status: AC | PRN
  Administered 2022-10-23: 60 mL via INTRAVENOUS

## 2022-10-23 NOTE — H&P (Addendum)
History and Physical    Anna Lara JWJ:[id number] DOB: [birth year] DOA: 10/23/2022  PCP: Leanna Sato, MD (Confirm with patient/family/NH records and if not entered, this has to be entered at [facility] point of entry) Patient coming from: Home  I have personally briefly reviewed patient's old medical records in [facility]  Chief Complaint: Feeling nausea, feeling weak  HPI: Anna Lara is a 89 y.o. female with medical history significant of HTN, hypertrophic cardiomyopathy, severe MR, severe TR, hypothyroidism, chronic dysphagia secondary to remote head and neck cancer (cured 25 years ago) on PEG tube feeding, CKD stage IIIa, IIDM, brought in by family member for feeling nausea, generalized weakness.  Symptoms started about 4 days ago, patient started to have nausea and vomited 1 time of stomach content.  But there is no abnormal pain or diarrhea.  No fever or chills.  Last 3 days, her symptoms has gradually become worse.  Yesterday patient started to have generalized weakness and stayed in the bed the whole day yesterday.  Family started patient on as needed 2 friends with little effect.  Family also reported 2-3 people in their church were diagnosed with COVID last week.  Patient denies any dysuria, no chest pains or shortness of breath. ED Course: Patient was found with tachycardia heart rate in the 120s, prolonged hypotension SBP lower 100s, not hypoxic temperature 99.1.  Chest x-ray showed no acute infiltrates, abdominal x-ray showed no signs of SBO.  COVID test came back positive, currently 1.2 compared to her baseline 1.0-1.2, WBC 4.1.  Troponin 83.  UA showed WBC 10-20.  D-dimer elevated  Patient was started on ceftriaxone x 1 and small IV bolus 500 ml.  CTA ordered by ED physician  Review of Systems: As per HPI otherwise 14 point review of systems negative.    Past Medical History:  Diagnosis Date   Cancer (HCC)    throat cancer   Heart murmur    Hypertension     Thyroid disease    Vertigo     Past Surgical History:  Procedure Laterality Date   GASTROSTOMY TUBE PLACEMENT       reports that she has never smoked. She has never used smokeless tobacco. She reports that she does not drink alcohol and does not use drugs.  No Known Allergies  History reviewed. No pertinent family history.   Prior to Admission medications   Medication Sig Start Date End Date Taking? Authorizing Provider  amLODipine (NORVASC) 10 MG tablet Take 10 mg by mouth daily. 03/22/21   [provider]  aspirin EC 81 MG tablet Take 81 mg by mouth daily.     [provider]  ferrous sulfate 325 (65 FE) MG tablet Take by mouth. Patient not taking: Reported on 05/13/2021    [provider]  Iron 15 MG/1.5ML SUSP Take 3 mLs (30 mg total) by mouth 2 (two) times daily. Take every other day. Patient not taking: Reported on 05/13/2021 05/25/16   Willy Eddy, MD  levothyroxine (SYNTHROID) 112 MCG tablet Take 112 mcg by mouth every morning. 03/27/21   [provider]  lisinopril (PRINIVIL,ZESTRIL) 40 MG tablet Take 40 mg by mouth every morning.     [provider]  metoprolol succinate (TOPROL-XL) 25 MG 24 hr tablet Take 25 mg by mouth every morning. 02/06/21   [provider]    Physical Exam: Vitals:   10/23/22 0722 10/23/22 0813 10/23/22 0830  BP: (!) 179/123 (!) 104/50 107/69  Pulse: Marland Kitchen)  116 86 80  Resp: (!) 22 (!) 34 (!) 28  Temp: 99.1 F (37.3 C)    TempSrc: Oral    SpO2: 94% 100% 100%    Constitutional: NAD, calm, comfortable Vitals:   10/23/22 0722 10/23/22 0813 10/23/22 0830  BP: (!) 179/123 (!) 104/50 107/69  Pulse: (!) 116 86 80  Resp: (!) 22 (!) 34 (!) 28  Temp: 99.1 F (37.3 C)    TempSrc: Oral    SpO2: 94% 100% 100%   Eyes: PERRL, lids and conjunctivae normal ENMT: Mucous membranes are dry. Posterior pharynx clear of any exudate or lesions.Normal dentition.  Neck: normal, supple, no masses, no  thyromegaly Respiratory: clear to auscultation bilaterally, no wheezing, no crackles. Normal respiratory effort. No accessory muscle use.  Cardiovascular: Regular rate and rhythm, systolic murmur on apex. No extremity edema. 2+ pedal pulses. No carotid bruits.  Abdomen: no tenderness, no masses palpated. No hepatosplenomegaly. Bowel sounds positive.  Musculoskeletal: no clubbing / cyanosis. No joint deformity upper and lower extremities. Good ROM, no contractures. Normal muscle tone.  Skin: no rashes, lesions, ulcers. No induration Neurologic: CN 2-12 grossly intact. Sensation intact, DTR normal. Strength 5/5 in all 4.  Psychiatric: Normal judgment and insight. Alert and oriented x 3. Normal mood.     Labs on Admission: I have personally reviewed following labs and imaging studies  CBC: Recent Labs  Lab 10/23/22 0743  WBC 4.1  HGB 12.5  HCT 38.8  MCV 85.1  PLT 149*   Basic Metabolic Panel: Recent Labs  Lab 10/23/22 0743  NA 133*  K 3.9  CL 99  CO2 24  GLUCOSE 333*  BUN 28*  CREATININE 1.27*  CALCIUM 8.2*   GFR: CrCl cannot be calculated (Unknown ideal weight.). Liver Function Tests: Recent Labs  Lab 10/23/22 0743  AST 40  ALT 21  ALKPHOS 72  BILITOT 0.1*  PROT 7.1  ALBUMIN 3.3*   Recent Labs  Lab 10/23/22 0743  LIPASE 51   No results for input(s): "AMMONIA" in the last 168 hours. Coagulation Profile: Recent Labs  Lab 10/23/22 0743  INR 1.1   Cardiac Enzymes: No results for input(s): "CKTOTAL", "CKMB", "CKMBINDEX", "TROPONINI" in the last 168 hours. BNP (last 3 results) No results for input(s): "PROBNP" in the last 8760 hours. HbA1C: No results for input(s): "HGBA1C" in the last 72 hours. CBG: No results for input(s): "GLUCAP" in the last 168 hours. Lipid Profile: No results for input(s): "CHOL", "HDL", "LDLCALC", "TRIG", "CHOLHDL", "LDLDIRECT" in the last 72 hours. Thyroid Function Tests: No results for input(s): "TSH", "T4TOTAL", "FREET4",  "T3FREE", "THYROIDAB" in the last 72 hours. Anemia Panel: No results for input(s): "VITAMINB12", "FOLATE", "FERRITIN", "TIBC", "IRON", "RETICCTPCT" in the last 72 hours. Urine analysis:    Component Value Date/Time   COLORURINE YELLOW (A) 10/23/2022 0747   APPEARANCEUR CLOUDY (A) 10/23/2022 0747   APPEARANCEUR Clear 12/25/2012 2100   LABSPEC 1.015 10/23/2022 0747   LABSPEC 1.012 12/25/2012 2100   PHURINE 7.0 10/23/2022 0747   GLUCOSEU 50 (A) 10/23/2022 0747   GLUCOSEU >=500 12/25/2012 2100   HGBUR NEGATIVE 10/23/2022 0747   BILIRUBINUR NEGATIVE 10/23/2022 0747   BILIRUBINUR Negative 12/25/2012 2100   KETONESUR NEGATIVE 10/23/2022 0747   PROTEINUR 30 (A) 10/23/2022 0747   NITRITE NEGATIVE 10/23/2022 0747   LEUKOCYTESUR SMALL (A) 10/23/2022 0747   LEUKOCYTESUR Negative 12/25/2012 2100    Radiological Exams on Admission: DG Abd 2 Views  Result Date: 10/23/2022 CLINICAL DATA:  Tachypnea, nausea. EXAM: ABDOMEN - 2  VIEW COMPARISON:  None Available. FINDINGS: There is a nonobstructive bowel gas pattern. There is no abnormal soft tissue calcification. There is no definite free intraperitoneal air. There is no acute osseous abnormality. IMPRESSION: Unremarkable KUB. Electronically Signed   By: Lesia Hausen M.D.   On: 10/23/2022 08:18   DG Chest 2 View  Result Date: 10/23/2022 CLINICAL DATA:  One day history of weakness and nausea EXAM: CHEST - 2 VIEW COMPARISON:  Chest radiograph dated 04/08/2017 FINDINGS: Normal lung volumes. No focal consolidations. Trace blunting of the posterior costophrenic angles. No pneumothorax. Enlarged cardiomediastinal silhouette. No acute osseous abnormality. IMPRESSION: 1. Trace blunting of the posterior costophrenic angles, which may represent trace pleural effusions. 2. Enlarged cardiomediastinal silhouette. Electronically Signed   By: Agustin Cree M.D.   On: 10/23/2022 08:18    EKG: Independently reviewed.  Sinus, LVH, chronic nonspecific ST  changes  Assessment/Plan Principal Problem:   COVID Active Problems:   COVID-19 virus infection  (please populate well all problems here in Problem List. (For example, if patient is on BP meds at home and you resume or decide to hold them, it is a problem that needs to be her. Same for CAD, COPD, HLD and so on)  Acute ambulation dysfunction Generalized weakness -Likely related to acute COVID-19 infection -Symptomatic management -PT evaluation -Other Ddx, no urinary symptoms, hold off antibiotics  SIRS vs early sepsis -Elevated lactic acid and tachycardia, with source of infection likely COVID-19 -Given the history of severe MR and TR, will hold off maintenance IV fluid  COVID-19 infection -Paxlovid x 5 days -Patient had COVID-19 vaccination x 2 and booster x 1 -D-dimer elevated, CTA pending to rule out PE -As needed Zofran and as needed Imodium for nauseous vomiting and diarrhea  IIDM with hyperglycemia -Start SSI -Check A1c, may need to go home with diabetic medications  HTN -Borderline hypotension, will hold off amlodipine and hydrochlorothiazide -Continue metoprolol -Start as needed hydralazine  Hypothyroidism -Continue Synthroid  CKD stage IIIa -Clinically appeared to be mildly volume depleted, received 500 mL IV bolus in the ED.  No significant worsening of kidney function.  Given there is a history of severe MR and TR, will hold off further IV fluid.  Elevated troponins -Pattern of troponin elevation is flat, likely demanding ischemia on top on CKD, ACS unlikely. -Check Echo    DVT prophylaxis: Lovenox Code Status: Full code Family Communication: Daughter and son at bedside Disposition Plan: Expect less than 2 midnight hospital stay Consults called: None Admission status: Tele obs   Emeline General MD [facility] Pager [phone]  10/23/2022, 10:47 AM

## 2022-10-23 NOTE — Plan of Care (Signed)
  Problem: Education: Goal: Ability to describe self-care measures that may prevent or decrease complications (Diabetes Survival Skills Education) will improve Outcome: Progressing Goal: Individualized Educational Video(s) Outcome: Progressing   Problem: Coping: Goal: Ability to adjust to condition or change in health will improve Outcome: Progressing   Problem: Fluid Volume: Goal: Ability to maintain a balanced intake and output will improve Outcome: Progressing   Problem: Health Behavior/Discharge Planning: Goal: Ability to identify and utilize available resources and services will improve Outcome: Progressing Goal: Ability to manage health-related needs will improve Outcome: Progressing   Problem: Metabolic: Goal: Ability to maintain appropriate glucose levels will improve Outcome: Progressing   Problem: Nutritional: Goal: Maintenance of adequate nutrition will improve Outcome: Progressing Goal: Progress toward achieving an optimal weight will improve Outcome: Progressing   Problem: Skin Integrity: Goal: Risk for impaired skin integrity will decrease Outcome: Progressing   Problem: Tissue Perfusion: Goal: Adequacy of tissue perfusion will improve Outcome: Progressing   Problem: Education: Goal: Knowledge of risk factors and measures for prevention of condition will improve Outcome: Progressing   Problem: Coping: Goal: Psychosocial and spiritual needs will be supported Outcome: Progressing   Problem: Respiratory: Goal: Will maintain a patent airway Outcome: Progressing Goal: Complications related to the disease process, condition or treatment will be avoided or minimized Outcome: Progressing   Problem: Education: Goal: Knowledge of General Education information will improve Description: Including pain rating scale, medication(s)/side effects and non-pharmacologic comfort measures Outcome: Progressing   Problem: Health Behavior/Discharge Planning: Goal:  Ability to manage health-related needs will improve Outcome: Progressing   Problem: Clinical Measurements: Goal: Ability to maintain clinical measurements within normal limits will improve Outcome: Progressing Goal: Will remain free from infection Outcome: Progressing Goal: Diagnostic test results will improve Outcome: Progressing Goal: Respiratory complications will improve Outcome: Progressing Goal: Cardiovascular complication will be avoided Outcome: Progressing   Problem: Activity: Goal: Risk for activity intolerance will decrease Outcome: Progressing   Problem: Nutrition: Goal: Adequate nutrition will be maintained Outcome: Progressing   Problem: Coping: Goal: Level of anxiety will decrease Outcome: Progressing   Problem: Elimination: Goal: Will not experience complications related to bowel motility Outcome: Progressing Goal: Will not experience complications related to urinary retention Outcome: Progressing   Problem: Pain Managment: Goal: General experience of comfort will improve Outcome: Progressing   Problem: Safety: Goal: Ability to remain free from injury will improve Outcome: Progressing   Problem: Skin Integrity: Goal: Risk for impaired skin integrity will decrease Outcome: Progressing   

## 2022-10-23 NOTE — Progress Notes (Signed)
Initial Nutrition Assessment  DOCUMENTATION CODES:   Non-severe (moderate) malnutrition in context of chronic illness  INTERVENTION:   -Resume home TF regimen via PEG per pt/ family request:  237 ml Ensure Enlive TID  100 ml free water flush before and after each feeding administration  Tube feeding regimen provides 1050 kcal (72% of needs), 60 grams of protein, and 484 ml of H2O. Total free water: 1084 ml daily  NUTRITION DIAGNOSIS:   Moderate Malnutrition related to chronic illness (chronic dysphagia 2/2 remote head and neck cancer) as evidenced by mild fat depletion, moderate fat depletion, mild muscle depletion, moderate muscle depletion.  GOAL:   Patient will meet greater than or equal to 90% of their needs  MONITOR:   TF tolerance  REASON FOR ASSESSMENT:   Consult Enteral/tube feeding initiation and management  ASSESSMENT:   Pt with medical history significant of HTN, hypertrophic cardiomyopathy, severe MR, severe TR, hypothyroidism, chronic dysphagia secondary to remote head and neck cancer (cured 25 years ago) on PEG tube feeding, CKD stage IIIa, IIDM, brought in for nausea and generalized weakness.  Pt admitted with COVID and generalized weakness.   Spoke with pt and family members at bedside. All confirm that pt does not take anything by mouth and uses PEG tube for sole source nutrition. Per daughter, pt has been using Ensure as TF formula for the past 25 years since PEG was placed. Pt receives TF formula and supplies through [employer] [facility] and pt's PCP manages prescriptions for TF. Family denies ever using a standard TF formula in the past and has only tried Ensure.   Per daughter, PCP was concerned over increased blood sugar levels at visit. Family is wondering if there is a better formula option. RD explained that Ensure Plus/ Ensure Enlive is manufactured to be used as on oral nutrition supplement and is not nutritionally complete; it is not designed to be used as  a sole source formula. RD broached topic of trying a standard TF formula (such as Jevity or Osmolite) to see if this will help manage blood sugar levels and better meet pt's nutritional needs. Family very appreciative of information, but hesitant to change TF formula and prefers to stick with home regimen. Findings and case discussed with RN.   Pt denies any weight loss, which is confirmed via chart review.   No results found for: "HGBA1C" PTA DM medications are none.   Labs reviewed: Na: 133, CBGS: 121 (inpatient orders for glycemic control are 0-9 units insulin aspart TID with meals).    NUTRITION - FOCUSED PHYSICAL EXAM:  Flowsheet Row Most Recent Value  Orbital Region No depletion  Upper Arm Region Moderate depletion  Thoracic and Lumbar Region No depletion  Buccal Region Mild depletion  Temple Region Mild depletion  Clavicle Bone Region Moderate depletion  Clavicle and Acromion Bone Region Moderate depletion  Scapular Bone Region Moderate depletion  Dorsal Hand Mild depletion  Patellar Region No depletion  Anterior Thigh Region No depletion  Posterior Calf Region No depletion  Edema (RD Assessment) None  Hair Reviewed  Eyes Reviewed  Mouth Reviewed  Skin Reviewed  Nails Reviewed       Diet Order:   Diet Order             Diet NPO time specified  Diet effective now                   EDUCATION NEEDS:   Education needs have been addressed  Skin:  Skin  Assessment: Reviewed RN Assessment  Last BM:  Unknown  Height:   Ht Readings from Last 1 Encounters:  10/23/22 5\' 4"  (1.626 m)    Weight:   Wt Readings from Last 1 Encounters:  10/23/22 57.1 kg    Ideal Body Weight:  54.5 kg  BMI:  Body mass index is 21.61 kg/m.  Estimated Nutritional Needs:   Kcal:  1450-1650  Protein:  75-90 grams  Fluid:  > 1.4 L    Levada Schilling, RD, LDN, CDCES Registered Dietitian II Certified Diabetes Care and Education Specialist Please refer to [facility] for RD and/or  RD on-call/weekend/after hours pager

## 2022-10-23 NOTE — ED Notes (Signed)
Initial lactic acid result given to Dr.Quale, no new orders at this time waiting for UA results.

## 2022-10-23 NOTE — ED Provider Notes (Signed)
[facility] Provider Note    Event Date/Time   First MD Initiated Contact with Patient 10/23/22 0725     (approximate)   History   Nausea and Weakness   HPI  Anna Lara is a 89 y.o. female history of throat cancer, hypertension thyroid disease and vertigo   For just over 1 day now has not been feeling well.  She reports feeling weak, tired, and nauseated.  She cannot describe any pain or discomfort, but reports she just does not feel well at all.  She is not in any pain or discomfort, but reports she felt very nauseated did not eat well yesterday.  She has a history of a feeding tube for years tube to previous head neck cancer.  She is here with her daughter, both reports that she has had poor appetite nausea fatigue weakness and generally not feeling well.  Typically denies headache chest pain trouble breathing abdominal pain diarrhea or other concerns  Daughter does relate that she has not had any of her evening blood pressure medicine lisinopril, nor her morning medication including metoprolol.  Patient has not had her typical tube feeds due to feeling nauseated for the last day  Physical Exam   Triage Vital Signs: ED Triage Vitals  Encounter Vitals Group     BP 10/23/22 0722 (!) 179/123     Systolic BP Percentile --      Diastolic BP Percentile --      Pulse Rate 10/23/22 0722 (!) 116     Resp 10/23/22 0722 (!) 22     Temp 10/23/22 0722 99.1 F (37.3 C)     Temp Source 10/23/22 0722 Oral     SpO2 10/23/22 0722 94 %     Weight --      Height --      Head Circumference --      Peak Flow --      Pain Score 10/23/22 0723 0     Pain Loc --      Pain Education --      Exclude from Growth Chart --     Most recent vital signs: Vitals:   10/23/22 0813 10/23/22 0830  BP: (!) 104/50 107/69  Pulse: 86 80  Resp: (!) 34 (!) 28  Temp:    SpO2: 100% 100%     General: Awake, no distress, except she appears fatigued and notably  tachypneic.  Oral mucous membranes are dry CV:  Good peripheral perfusion.  Tachycardia, moderate systolic ejection murmur (per daughter at the bedside has a known history of cardiac murmur) Resp:  Normal effort but tachypneic.  Lung sounds are clear bilaterally.  No rales no wheezing. Abd:  No distention.  Feeding tube located in the left upper quadrant.  Abdomen soft nontender nondistended throughout. Other:  ***   ED Results / Procedures / Treatments   Labs (all labs ordered are listed, but only abnormal results are displayed) Labs Reviewed  RESP PANEL BY RT-PCR (FLU A&B, COVID) ARPGX2 - Abnormal; Notable for the following components:      Result Value   SARS Coronavirus 2 by RT PCR POSITIVE (*)    All other components within normal limits  CBC - Abnormal; Notable for the following components:   Platelets 149 (*)    All other components within normal limits  BRAIN NATRIURETIC PEPTIDE - Abnormal; Notable for the following components:   B Natriuretic Peptide 1,370.1 (*)    All other components within normal  limits  LACTIC ACID, PLASMA - Abnormal; Notable for the following components:   Lactic Acid, Venous 2.6 (*)    All other components within normal limits  URINALYSIS, W/ REFLEX TO CULTURE (INFECTION SUSPECTED) - Abnormal; Notable for the following components:   Color, Urine YELLOW (*)    APPearance CLOUDY (*)    Glucose, UA 50 (*)    Protein, ur 30 (*)    Leukocytes,Ua SMALL (*)    Bacteria, UA FEW (*)    All other components within normal limits  D-DIMER, QUANTITATIVE - Abnormal; Notable for the following components:   D-Dimer, Quant 0.94 (*)    All other components within normal limits  CULTURE, BLOOD (ROUTINE X 2)  CULTURE, BLOOD (ROUTINE X 2)  URINE CULTURE  PROTIME-INR  COMPREHENSIVE METABOLIC PANEL  LIPASE, BLOOD  LACTIC ACID, PLASMA  TROPONIN I (HIGH SENSITIVITY)  TROPONIN I (HIGH SENSITIVITY)   Labs notable for normal white count.  Positive COVID.  Lactic acid  mildly elevated.  Also notably her BNP is quite elevated.  D-dimer is elevated, however in the setting of active COVID-19 I suspect this explains the elevated D-dimer.  She does not have ongoing tachycardia she does not have hypoxia, doubtful of thromboembolism with no associated chest pains-and D-dimer additionally essentially age-adjusts.   EKG  And interpreted by me at 735 heart rate 105 QRS 100 QTc 430 Probable underlying left ventricular hypertrophy with repolarization abnormality.  Sinus tachycardia.  Notable baseline artifact, attempted several times but due to the patient's elevated respiratory rate difficult to obtain and even baseline  Morphology appears similar to EKG in 2023   RADIOLOGY  Chest x-ray interpreted by me as mild pleural effusions.  No consolidations.  DG Abd 2 Views  Result Date: 10/23/2022 CLINICAL DATA:  Tachypnea, nausea. EXAM: ABDOMEN - 2 VIEW COMPARISON:  None Available. FINDINGS: There is a nonobstructive bowel gas pattern. There is no abnormal soft tissue calcification. There is no definite free intraperitoneal air. There is no acute osseous abnormality. IMPRESSION: Unremarkable KUB. Electronically Signed   By: Lesia Hausen M.D.   On: 10/23/2022 08:18   DG Chest 2 View  Result Date: 10/23/2022 CLINICAL DATA:  One day history of weakness and nausea EXAM: CHEST - 2 VIEW COMPARISON:  Chest radiograph dated 04/08/2017 FINDINGS: Normal lung volumes. No focal consolidations. Trace blunting of the posterior costophrenic angles. No pneumothorax. Enlarged cardiomediastinal silhouette. No acute osseous abnormality. IMPRESSION: 1. Trace blunting of the posterior costophrenic angles, which may represent trace pleural effusions. 2. Enlarged cardiomediastinal silhouette. Electronically Signed   By: Agustin Cree M.D.   On: 10/23/2022 08:18    PROCEDURES:  Critical Care performed: {CriticalCareYesNo:19197::"Yes, see critical care procedure  note(s)","No"}  Procedures   MEDICATIONS ORDERED IN ED: Medications  sodium chloride 0.9 % bolus 500 mL (has no administration in time range)  acetaminophen (TYLENOL) 160 MG/5ML solution 650 mg (has no administration in time range)  ondansetron (ZOFRAN) injection 4 mg (4 mg Intravenous Given 10/23/22 0814)     IMPRESSION / MDM / ASSESSMENT AND PLAN / ED COURSE  I reviewed the triage vital signs and the nursing notes.                              Differential diagnosis includes, but is not limited to, possible infection, sepsis, metabolic abnormality, given her nausea but no active vomiting or diarrhea suspect this is secondary to some sort of a metabolic process  or infection, but other considerations such as obstruction are considered.  Will initiate a broad workup.  Also on the differential if no other cause identified would be other causes for tachycardia such as pulmonary embolism, etc.  With her lack of associated abdominal pain and just nauseated seems unlikely to represent acute intra-abdominal causation but this does need consideration.  Will work to rule out causes and evaluate further.  I suspect some of her tachycardia and hypertension may be related to not having had her metoprolol or blood pressure medication for approximately 24 hours.  Do not wish to give her a full dose of lisinopril at this time, but I think we can reasonably start by giving her a small dose of IV metoprolol and monitor for changes.    Patient's presentation is most consistent with {EM COPA:27473}   The patient is on the cardiac monitor to evaluate for evidence of arrhythmia and/or significant heart rate changes.   Vitals:   10/23/22 0813 10/23/22 0830  BP: (!) 104/50 107/69  Pulse: 86 80  Resp: (!) 34 (!) 28  Temp:    SpO2: 100% 100%    Heart rate has improved.  Respiratory rate remains tachypneic but no hypoxia.  Suspect increased metabolic demand to the presence of COVID-19.  Awaiting  chemistry panel  FINAL CLINICAL IMPRESSION(S) / ED DIAGNOSES   Final diagnoses:  COVID-19     Rx / DC Orders   ED Discharge Orders     None        Note:  This document was prepared using Dragon voice recognition software and may include unintentional dictation errors.

## 2022-10-23 NOTE — ED Notes (Signed)
Pt at CT

## 2022-10-23 NOTE — ED Notes (Signed)
Lab called lab to figure why CMP and trop have not been resulted, lab states it should result shortly.

## 2022-10-23 NOTE — ED Triage Notes (Addendum)
Pt to ED via POV from home accompanied by daughter. Pt reports nausea and increasing weakness since yesterday. Pt has been taking PRN zofran without relief. Pt also reports cough. Pt denies fever, abdominal pain, CP or SOB. Pt reports hx of HTN and states has been med compliant.

## 2022-10-23 NOTE — Progress Notes (Signed)
Elink is following code sepsis bundle.

## 2022-10-23 NOTE — ED Notes (Signed)
Warm blanket given

## 2022-10-23 NOTE — ED Notes (Signed)
Pt presents to ED with c/o of feeling weak since yesterday. Pt endorses nausea as her main complaint. Pt denies any urinary s/s. Pt denies chills. Pt denies any sick contacts. Pt does present hypertensive and states HX of same. Pt is also tachypnea and seems to have some respiratory issues going on but pt denies any new cough to this RN. NAD noted. Pt is A&Ox4 at this time.   Daughter at bedside.

## 2022-10-23 NOTE — ED Notes (Signed)
EDT taking pt to room to obtain EKG.

## 2022-10-23 NOTE — Consult Note (Signed)
CODE SEPSIS - PHARMACY COMMUNICATION  **Broad-spectrum antimicrobials should be administered within one hour of sepsis diagnosis**  Time Code Sepsis call or page was received: 1007  Antibiotics ordered: Ceftriaxone  Time of first antibiotic administration: 1034  Additional action taken by pharmacy: N/A  If necessary, name of provider/nurse contacted: N/A    Will M. Dareen Piano, PharmD Clinical Pharmacist 10/23/2022 10:08 AM

## 2022-10-23 NOTE — ED Notes (Signed)
Before admin of metoprolol BP taken and pt found to be 104/50 with a MAP of 67, med held at this time, MD aware.

## 2022-10-24 DIAGNOSIS — D696 Thrombocytopenia, unspecified: Secondary | ICD-10-CM | POA: Insufficient documentation

## 2022-10-24 DIAGNOSIS — N1832 Chronic kidney disease, stage 3b: Secondary | ICD-10-CM

## 2022-10-24 DIAGNOSIS — I5032 Chronic diastolic (congestive) heart failure: Secondary | ICD-10-CM

## 2022-10-24 DIAGNOSIS — E440 Moderate protein-calorie malnutrition: Secondary | ICD-10-CM | POA: Insufficient documentation

## 2022-10-24 DIAGNOSIS — U071 COVID-19: Secondary | ICD-10-CM | POA: Diagnosis not present

## 2022-10-24 LAB — GLUCOSE, CAPILLARY
Glucose-Capillary: 108 mg/dL — ABNORMAL HIGH (ref 70–99)
Glucose-Capillary: 115 mg/dL — ABNORMAL HIGH (ref 70–99)
Glucose-Capillary: 117 mg/dL — ABNORMAL HIGH (ref 70–99)
Glucose-Capillary: 134 mg/dL — ABNORMAL HIGH (ref 70–99)

## 2022-10-24 MED ORDER — NIRMATRELVIR/RITONAVIR (PAXLOVID) TABLET (RENAL DOSING): 2.0000 | ORAL_TABLET | Freq: Two times a day (BID) | ORAL | 0 refills | Status: AC

## 2022-10-24 MED ORDER — FUROSEMIDE 20 MG PO TABS: 20.0000 mg | ORAL_TABLET | Freq: Every day | ORAL | 0 refills | Status: DC

## 2022-10-24 NOTE — Plan of Care (Signed)
  Problem: Education: Goal: Ability to describe self-care measures that may prevent or decrease complications (Diabetes Survival Skills Education) will improve Outcome: Progressing Goal: Individualized Educational Video(s) Outcome: Progressing   Problem: Coping: Goal: Ability to adjust to condition or change in health will improve Outcome: Progressing   Problem: Fluid Volume: Goal: Ability to maintain a balanced intake and output will improve Outcome: Progressing   Problem: Health Behavior/Discharge Planning: Goal: Ability to identify and utilize available resources and services will improve Outcome: Progressing Goal: Ability to manage health-related needs will improve Outcome: Progressing   Problem: Metabolic: Goal: Ability to maintain appropriate glucose levels will improve Outcome: Progressing   Problem: Nutritional: Goal: Maintenance of adequate nutrition will improve Outcome: Progressing Goal: Progress toward achieving an optimal weight will improve Outcome: Progressing   Problem: Skin Integrity: Goal: Risk for impaired skin integrity will decrease Outcome: Progressing   Problem: Tissue Perfusion: Goal: Adequacy of tissue perfusion will improve Outcome: Progressing   Problem: Education: Goal: Knowledge of risk factors and measures for prevention of condition will improve Outcome: Progressing   Problem: Coping: Goal: Psychosocial and spiritual needs will be supported Outcome: Progressing   Problem: Respiratory: Goal: Will maintain a patent airway Outcome: Progressing Goal: Complications related to the disease process, condition or treatment will be avoided or minimized Outcome: Progressing   Problem: Education: Goal: Knowledge of General Education information will improve Description: Including pain rating scale, medication(s)/side effects and non-pharmacologic comfort measures Outcome: Progressing   Problem: Health Behavior/Discharge Planning: Goal:  Ability to manage health-related needs will improve Outcome: Progressing   Problem: Clinical Measurements: Goal: Ability to maintain clinical measurements within normal limits will improve Outcome: Progressing Goal: Will remain free from infection Outcome: Progressing Goal: Diagnostic test results will improve Outcome: Progressing Goal: Respiratory complications will improve Outcome: Progressing Goal: Cardiovascular complication will be avoided Outcome: Progressing   Problem: Activity: Goal: Risk for activity intolerance will decrease Outcome: Progressing   Problem: Nutrition: Goal: Adequate nutrition will be maintained Outcome: Progressing   Problem: Coping: Goal: Level of anxiety will decrease Outcome: Progressing   Problem: Elimination: Goal: Will not experience complications related to bowel motility Outcome: Progressing Goal: Will not experience complications related to urinary retention Outcome: Progressing   Problem: Pain Managment: Goal: General experience of comfort will improve Outcome: Progressing   Problem: Safety: Goal: Ability to remain free from injury will improve Outcome: Progressing   Problem: Skin Integrity: Goal: Risk for impaired skin integrity will decrease Outcome: Progressing   

## 2022-10-24 NOTE — Hospital Course (Addendum)
Anna Lara is a 89 y.o. female with medical history significant of HTN, hypertrophic cardiomyopathy, severe MR, severe TR, hypothyroidism, chronic dysphagia secondary to remote head and neck cancer (cured 25 years ago) on PEG tube feeding, CKD stage IIIa, IIDM, brought in by family member for feeling nausea, generalized weakness.  She was found to have a possible COVID, but she denied any shortness of breath, CT chest showed small area of groundglass changes, aortic aneurysm 4.2 cm, requires follow-up imaging study.  She has a mildly elevated troponin at 80, but elevated BNP at 1370.  Patient was monitored overnight, treated with Paxlovid.  Currently, patient does not have any complaint, appetite improved.  Currently medically stable to be discharged. Echocardiogram showed severe LV hypertrophy with normal ejection fraction given elevated BNP, I have started patient on 20 mg of Lasix, follow-up with cardiology in 2 weeks.

## 2022-10-24 NOTE — Evaluation (Signed)
Physical Therapy Evaluation Patient Details Name: Anna Lara MRN: [id number] DOB: September 18, 1933 Today's Date: 10/24/2022  History of Present Illness  Pt is a 89 y.o. female with medical history significant of HTN, hypertrophic cardiomyopathy, severe MR, severe TR, hypothyroidism, chronic dysphagia secondary to remote head and neck cancer (cured 25 years ago) on PEG tube feeding, CKD stage IIIa, IIDM, brought in by family member for feeling nausea, generalized weakness.  Clinical Impression   Pt received in bed, she is agreeable to PT session. Pt did not demonstrate impaired cognition and able to follow direction throughout session. Pt performs bed mobility, transfers, and amb with supervision for safety but otherwise independent. Vitals were monitored throughout session and maintained WNL. Mild SOB reported for following last lap of amb. Skilled PT is not needed at this time due to Pt at baseline.         If plan is discharge home, recommend the following: A little help with walking and/or transfers;A little help with bathing/dressing/bathroom;Assist for transportation   Can travel by private vehicle        Equipment Recommendations None recommended by PT  Recommendations for Other Services       Functional Status Assessment Patient has not had a recent decline in their functional status     Precautions / Restrictions Precautions Precautions: Fall Restrictions Weight Bearing Restrictions: No      Mobility  Bed Mobility Overal bed mobility: Independent             General bed mobility comments: Able to amb with supervision for safety    Transfers Overall transfer level: Independent Equipment used: None               General transfer comment: no assistance required    Ambulation/Gait Ambulation/Gait assistance: Supervision Gait Distance (Feet): 50 Feet Assistive device: None Gait Pattern/deviations: WFL(Within Functional Limits) Gait velocity: WNL      General Gait Details: Pt able to amb with no difficulty noted  Stairs            Wheelchair Mobility     Tilt Bed    Modified Rankin (Stroke Patients Only)       Balance Overall balance assessment: Independent                                           Pertinent Vitals/Pain Pain Assessment Pain Assessment: No/denies pain    Home Living Family/patient expects to be discharged to:: Private residence Living Arrangements: Children Available Help at Discharge: Family Type of Home: House Home Access: Stairs to enter Entrance Stairs-Rails: None Entrance Stairs-Number of Steps: 1   Home Layout: One level Home Equipment: None Additional Comments: 1 step inside home that leads to living room, kitchen, bedroom.    Prior Function Prior Level of Function : Independent/Modified Independent             Mobility Comments: Amb independently without AD ADLs Comments: Independent     Extremity/Trunk Assessment   Upper Extremity Assessment Upper Extremity Assessment: Overall WFL for tasks assessed    Lower Extremity Assessment Lower Extremity Assessment: Overall WFL for tasks assessed       Communication   Communication Communication: No apparent difficulties Cueing Techniques: Verbal cues  Cognition Arousal: Alert Behavior During Therapy: WFL for tasks assessed/performed Overall Cognitive Status: Within Functional Limits for tasks assessed  General Comments: No impairments noted        General Comments      Exercises Other Exercises Other Exercises: Amb to bathroom with supervision and no assistance required for hygiene   Assessment/Plan    PT Assessment Patient does not need any further PT services  PT Problem List         PT Treatment Interventions      PT Goals (Current goals can be found in the Care Plan section)  Acute Rehab PT Goals Patient Stated Goal: to go home PT Goal  Formulation: With patient Time For Goal Achievement: 11/07/22 Potential to Achieve Goals: Good    Frequency       Co-evaluation               AM-PAC PT "6 Clicks" Mobility  Outcome Measure Help needed turning from your back to your side while in a flat bed without using bedrails?: None Help needed moving from lying on your back to sitting on the side of a flat bed without using bedrails?: None Help needed moving to and from a bed to a chair (including a wheelchair)?: None Help needed standing up from a chair using your arms (e.g., wheelchair or bedside chair)?: None Help needed to walk in hospital room?: None Help needed climbing 3-5 steps with a railing? : A Little 6 Click Score: 23    End of Session   Activity Tolerance: Patient tolerated treatment well Patient left: in chair;with call bell/phone within reach Nurse Communication: Mobility status PT Visit Diagnosis: Muscle weakness (generalized) (M62.81)    Time: 1610-9604 PT Time Calculation (min) (ACUTE ONLY): 26 min   Charges:                 Elmon Else, SPT     10/24/2022, 12:11 PM

## 2022-10-24 NOTE — Discharge Summary (Signed)
Physician Discharge Summary   Patient: Anna Lara MRN: [id number] DOB: 1933/09/10  Admit date:     10/23/2022  Discharge date: 10/24/22  Discharge Physician: Marrion Coy   PCP: Leanna Sato, MD   Recommendations at discharge:   Follow-up with PCP in 1 week. Follow-up with cardiology in 2 weeks.  Discharge Diagnoses: Principal Problem:   COVID Active Problems:   COVID-19 virus infection   Malnutrition of moderate degree   CKD stage 3b, GFR 30-44 ml/min (HCC)   Thrombocytopenia (HCC)   Chronic diastolic CHF (congestive heart failure) (HCC)  Resolved Problems:   * No resolved hospital problems. * Hyponatremia. Hospital Course: Anna Lara is a 89 y.o. female with medical history significant of HTN, hypertrophic cardiomyopathy, severe MR, severe TR, hypothyroidism, chronic dysphagia secondary to remote head and neck cancer (cured 25 years ago) on PEG tube feeding, CKD stage IIIa, IIDM, brought in by family member for feeling nausea, generalized weakness.  She was found to have a possible COVID, but she denied any shortness of breath, CT chest showed small area of groundglass changes, aortic aneurysm 4.2 cm, requires follow-up imaging study.  She has a mildly elevated troponin at 80, but elevated BNP at 1370.  Patient was monitored overnight, treated with Paxlovid.  Currently, patient does not have any complaint, appetite improved.  Currently medically stable to be discharged. Echocardiogram showed severe LV hypertrophy with normal ejection fraction given elevated BNP, I have started patient on 20 mg of Lasix, follow-up with cardiology in 2 weeks.  Assessment and Plan: COVID-pneumonia. Generalized weakness secondary to COVID. Sepsis secondary to COVID. I personally reviewed the patient's CT scan images, patient had groundglass changes in the base.  This could be due to COVID, and not examination would be secondary to congestive heart failure. Patient has no oxygen  requirement, I will continue Paxlovid and to complete the whole course.  Patient is unable to follow-up with PCP as outpatient.  Chronic diastolic congestive heart failure.  Possible hypertrophic cardiomyopathy. Elevated troponin secondary to COVID. Patient does not have any short of breath this time, but chest CT scan showed a trace pleural effusion and groundglass changes, could not rule out this is secondary to vascular congestion.  Patient also had a significant elevation BMP.  I will start low-dose Lasix: Follow-up with PCP and cardiology in the near future   Chronic kidney disease stage IIIb. I reviewed the patient previous chart, patient baseline GFR is less than 45, patient does not have stage IIIa, actually she has 3B. Renal function stable.   Thrombocytopenia secondary to COVID. Follow-up with PCP as outpatient.  Hypothyroidism. Continue home medicine       Consultants: None Procedures performed: None  Disposition: Home Diet recommendation:  Discharge Diet Orders (From admission, onward)     Start     Ordered   10/24/22 0000  Diet - low sodium heart healthy        10/24/22 1441           Cardiac diet DISCHARGE MEDICATION: Allergies as of 10/24/2022   No Known Allergies      Medication List     STOP taking these medications    lisinopril 40 MG tablet Commonly known as: ZESTRIL       TAKE these medications    amLODipine 10 MG tablet Commonly known as: NORVASC Take 10 mg by mouth daily.   aspirin EC 81 MG tablet Take 81 mg by mouth every evening.   furosemide 20  MG tablet Commonly known as: Lasix Take 1 tablet (20 mg total) by mouth daily.   levothyroxine 125 MCG tablet Commonly known as: SYNTHROID Take 125 mcg by mouth every morning.   metoprolol succinate 25 MG 24 hr tablet Commonly known as: TOPROL-XL Take 25 mg by mouth every morning.   nirmatrelvir/ritonavir (renal dosing) 10 x 150 MG & 10 x 100MG  Tabs Commonly known as:  PAXLOVID Take 2 tablets by mouth 2 (two) times daily for 5 days.        Follow-up Information     Leanna Sato, MD Follow up in 1 week(s).   Specialty: Family Medicine Contact information: [address] [place] [address] [phone]         Debbe Odea, MD Follow up in 2 week(s).   Specialties: Cardiology, Radiology Contact information: [address] [place] [address] [phone]                Discharge Exam: [name]   10/23/22 1231 10/24/22 0500  Weight: 57.1 kg 57.7 kg   Lara exam: Appears calm and comfortable  Respiratory system: Clear to auscultation. Respiratory effort normal. Cardiovascular system: S1 & S2 heard, RRR. No JVD, murmurs, rubs, gallops or clicks. No pedal edema. Gastrointestinal system: Abdomen is nondistended, soft and nontender. No organomegaly or masses felt. Normal bowel sounds heard. Central nervous system: Alert and oriented x3. No focal neurological deficits. Extremities: Symmetric 5 x 5 power. Skin: No rashes, lesions or ulcers Psychiatry: Judgement and insight appear normal. Mood & affect appropriate.    Condition at discharge: fair  The results of significant diagnostics from this hospitalization (including imaging, microbiology, ancillary and laboratory) are listed below for reference.   Imaging Studies: ECHOCARDIOGRAM COMPLETE  Result Date: 10/24/2022    ECHOCARDIOGRAM REPORT   Patient Name:   Anna Lara Date of Exam: 10/23/2022 Medical Rec #:  [id number]          Height:       64.0 in Accession #:    [id number]         Weight:       125.9 lb Date of Birth:  Jan 21, 1934          BSA:          1.607 m Patient Age:    89 years           BP:           179/123 mmHg Patient Gender: F                  HR:           85 bpm. Exam Location:  [facility] Procedure: 2D Echo, Cardiac Doppler and Color Doppler Indications:     Elevated Troponin  History:         Patient has no prior history of Echocardiogram  examinations.                  Signs/Symptoms:Murmur; Risk Factors:Hypertension.  Sonographer:     Daphine Deutscher RDCS Referring Phys:  [id number] Anna Lara Diagnosing Phys: Debbe Odea MD IMPRESSIONS  1. Lvot gradient noted. consider CMR for HCM eval if clinically indicated.. Left ventricular ejection fraction, by estimation, is 60 to 65%. The left ventricle has normal function. The left ventricle has no regional wall motion abnormalities. There is severe concentric left ventricular hypertrophy. Left ventricular diastolic parameters are consistent with Grade II diastolic dysfunction (pseudonormalization).  2. Right ventricular systolic function is  normal. The right ventricular size is normal.  3. Left atrial size was severely dilated.  4. The mitral valve is degenerative. Mild mitral valve regurgitation.  5. The aortic valve is tricuspid. Aortic valve regurgitation is not visualized. Aortic valve sclerosis is present, with no evidence of aortic valve stenosis.  6. The inferior vena cava is normal in size with greater than 50% respiratory variability, suggesting right atrial pressure of 3 mmHg. FINDINGS  Left Ventricle: Lvot gradient noted. consider CMR for HCM eval if clinically indicated. Left ventricular ejection fraction, by estimation, is 60 to 65%. The left ventricle has normal function. The left ventricle has no regional wall motion abnormalities. The left ventricular internal cavity size was normal in size. There is severe concentric left ventricular hypertrophy. Left ventricular diastolic parameters are consistent with Grade II diastolic dysfunction (pseudonormalization). Right Ventricle: The right ventricular size is normal. No increase in right ventricular wall thickness. Right ventricular systolic function is normal. Left Atrium: Left atrial size was severely dilated. Right Atrium: Right atrial size was normal in size. Pericardium: There is no evidence of pericardial effusion. Mitral Valve:  The mitral valve is degenerative in appearance. Mild mitral valve regurgitation, with posteriorly-directed jet. Tricuspid Valve: The tricuspid valve is normal in structure. Tricuspid valve regurgitation is mild. Aortic Valve: The aortic valve is tricuspid. Aortic valve regurgitation is not visualized. Aortic valve sclerosis is present, with no evidence of aortic valve stenosis. Pulmonic Valve: The pulmonic valve was not well visualized. Pulmonic valve regurgitation is not visualized. Aorta: The aortic root is normal in size and structure. Venous: The inferior vena cava is normal in size with greater than 50% respiratory variability, suggesting right atrial pressure of 3 mmHg. IAS/Shunts: No atrial level shunt detected by color flow Doppler.  LEFT VENTRICLE PLAX 2D LVIDd:         3.00 cm   Diastology LVIDs:         2.00 cm   LV e' medial:    3.26 cm/s LV PW:         1.60 cm   LV E/e' medial:  32.8 LV IVS:        1.60 cm   LV e' lateral:   5.55 cm/s LVOT diam:     2.10 cm   LV E/e' lateral: 19.3 LVOT Area:     3.46 cm  RIGHT VENTRICLE RV Basal diam:  3.20 cm RV S prime:     18.33 cm/s TAPSE (M-mode): 2.6 cm LEFT ATRIUM             Index        RIGHT ATRIUM           Index LA diam:        3.70 cm 2.30 cm/m   RA Area:     14.80 cm LA Vol (A2C):   79.0 ml 49.16 ml/m  RA Volume:   38.60 ml  24.02 ml/m LA Vol (A4C):   88.2 ml 54.89 ml/m LA Biplane Vol: 87.6 ml 54.51 ml/m   AORTA Ao Root diam: 3.30 cm MITRAL VALVE                TRICUSPID VALVE MV Area (PHT): 3.31 cm     TR Peak grad:   24.0 mmHg MV Decel Time: 229 msec     TR Vmax:        245.00 cm/s MV E velocity: 107.00 cm/s MV A velocity: 141.00 cm/s  SHUNTS MV E/A ratio:  0.76  Systemic Diam: 2.10 cm Debbe Odea MD Electronically signed by Debbe Odea MD Signature Date/Time: 10/24/2022/2:35:48 PM    Final (Updated)    CT Angio Chest PE W and/or Wo Contrast  Result Date: 10/23/2022 CLINICAL DATA:  Shortness of breath.  Chest pain.  COVID  positive EXAM: CT ANGIOGRAPHY CHEST WITH CONTRAST TECHNIQUE: Multidetector CT imaging of the chest was performed using the standard protocol during bolus administration of intravenous contrast. Multiplanar CT image reconstructions and MIPs were obtained to evaluate the vascular anatomy. RADIATION DOSE REDUCTION: This exam was performed according to the departmental dose-optimization program which includes automated exposure control, adjustment of the mA and/or kV according to patient size and/or use of iterative reconstruction technique. CONTRAST:  60mL OMNIPAQUE IOHEXOL 350 MG/ML SOLN COMPARISON:  X-ray 10/23/2022 FINDINGS: Cardiovascular: Heart is enlarged. There is also specific hypertrophy of the left ventricular wall. Please correlate with history. Small pericardial effusion. Coronary artery calcifications are seen. The thoracic aorta has some scattered calcified plaque. Diameter of the ascending aorta measures 4.2 by 4.1 cm. No pulmonary embolism identified. Mediastinum/Nodes: Slightly patulous thoracic esophagus. No specific abnormal lymph node enlargement identified in the axillary regions, hilum or mediastinum. Lungs/Pleura: Tiny bilateral pleural effusions. Apical pleural thickening. There is some linear opacity lung bases likely scar or atelectasis. No pneumothorax. There is some subtle patchy areas of ground-glass such as left upper lobe focally measuring up to 2.4 by 1.6 cm. Smaller focus seen on image 53 of series 6 in the left upper lobe. No areas of consolidation. Minimal areas of ground-glass in the middle lobe such as image 92 and 93 from series 6. Upper Abdomen: Along the upper abdomen the adrenal glands are preserved. Musculoskeletal: Slight curvature of the spine. Review of the MIP images confirms the above findings. IMPRESSION: No pulmonary embolism identified. Enlarged heart. There is particular left ventricular wall hypertrophy. Please correlate with clinical history. Mild vascular  calcifications. Ectasia of the ascending aorta measuring up to 4.2 cm. Recommend annual imaging followup by CTA or MRA. This recommendation follows 2010 ACCF/AHA/AATS/ACR/ASA/SCA/SCAI/SIR/STS/SVM Guidelines for the Diagnosis and Management of Patients with Thoracic Aortic Disease. Circulation. 2010; 121: V425-Z563. Aortic aneurysm NOS (ICD10-I71.9) Tiny pleural effusions. There are some areas of ground-glass identified with focal area measuring up to 2.4 cm in the left upper lobe. Recommend follow up in 6-12 months Aortic Atherosclerosis (ICD10-I70.0). Electronically Signed   By: Karen Kays M.D.   On: 10/23/2022 11:25   DG Abd 2 Views  Result Date: 10/23/2022 CLINICAL DATA:  Tachypnea, nausea. EXAM: ABDOMEN - 2 VIEW COMPARISON:  None Available. FINDINGS: There is a nonobstructive bowel gas pattern. There is no abnormal soft tissue calcification. There is no definite free intraperitoneal air. There is no acute osseous abnormality. IMPRESSION: Unremarkable KUB. Electronically Signed   By: Lesia Hausen M.D.   On: 10/23/2022 08:18   DG Chest 2 View  Result Date: 10/23/2022 CLINICAL DATA:  One day history of weakness and nausea EXAM: CHEST - 2 VIEW COMPARISON:  Chest radiograph dated 04/08/2017 FINDINGS: Normal lung volumes. No focal consolidations. Trace blunting of the posterior costophrenic angles. No pneumothorax. Enlarged cardiomediastinal silhouette. No acute osseous abnormality. IMPRESSION: 1. Trace blunting of the posterior costophrenic angles, which may represent trace pleural effusions. 2. Enlarged cardiomediastinal silhouette. Electronically Signed   By: Agustin Cree M.D.   On: 10/23/2022 08:18    Microbiology: Results for orders placed or performed during the hospital encounter of 10/23/22  Blood Culture (routine x 2)     Status:  None (Preliminary result)   Collection Time: 10/23/22  7:43 AM   Specimen: BLOOD  Result Value Ref Range Status   Specimen Description BLOOD RIGHT AC  Final   Special  Requests   Final    BOTTLES DRAWN AEROBIC AND ANAEROBIC Blood Culture results may not be optimal due to an inadequate volume of blood received in culture bottles   Culture   Final    NO GROWTH 1 DAY Performed at [facility], [address], [place], [place] [address]    Report Status PENDING  Incomplete  Resp Panel by RT-PCR (Flu A&B, Covid) Anterior Nasal Swab     Status: Abnormal   Collection Time: 10/23/22  7:43 AM   Specimen: Anterior Nasal Swab  Result Value Ref Range Status   SARS Coronavirus 2 by RT PCR POSITIVE (A) NEGATIVE Final    Comment: (NOTE) SARS-CoV-2 target nucleic acids are DETECTED.  The SARS-CoV-2 RNA is generally detectable in upper respiratory specimens during the acute phase of infection. Positive results are indicative of the presence of the identified virus, but do not rule out bacterial infection or co-infection with other pathogens not detected by the test. Clinical correlation with patient history and other diagnostic information is necessary to determine patient infection status. The expected result is Negative.  Fact Sheet for Patients: BloggerCourse.com  Fact Sheet for Healthcare Providers: SeriousBroker.it  This test is not yet approved or cleared by the [place] FDA and  has been authorized for detection and/or diagnosis of SARS-CoV-2 by FDA under an Emergency Use Authorization (EUA).  This EUA will remain in effect (meaning this test can be used) for the duration of  the COVID-19 declaration under Section 564(b)(1) of the A ct, 21 U.S.C. section 360bbb-3(b)(1), unless the authorization is terminated or revoked sooner.     Influenza A by PCR NEGATIVE NEGATIVE Final   Influenza B by PCR NEGATIVE NEGATIVE Final    Comment: (NOTE) The Xpert Xpress SARS-CoV-2/FLU/RSV plus assay is intended as an aid in the diagnosis of influenza from Nasopharyngeal swab specimens and should not be  used as a sole basis for treatment. Nasal washings and aspirates are unacceptable for Xpert Xpress SARS-CoV-2/FLU/RSV testing.  Fact Sheet for Patients: BloggerCourse.com  Fact Sheet for Healthcare Providers: SeriousBroker.it  This test is not yet approved or cleared by the [place] FDA and has been authorized for detection and/or diagnosis of SARS-CoV-2 by FDA under an Emergency Use Authorization (EUA). This EUA will remain in effect (meaning this test can be used) for the duration of the COVID-19 declaration under Section 564(b)(1) of the Act, 21 U.S.C. section 360bbb-3(b)(1), unless the authorization is terminated or revoked.  Performed at [facility], [address] [place], [place] [address]   Blood Culture (routine x 2)     Status: None (Preliminary result)   Collection Time: 10/23/22  7:45 AM   Specimen: BLOOD  Result Value Ref Range Status   Specimen Description BLOOD RIGHT HAND  Final   Special Requests   Final    BOTTLES DRAWN AEROBIC AND ANAEROBIC Blood Culture adequate volume   Culture   Final    NO GROWTH 1 DAY Performed at [facility], [address], [place], [place] [address]    Report Status PENDING  Incomplete  Urine Culture     Status: Abnormal (Preliminary result)   Collection Time: 10/23/22  7:47 AM   Specimen: Urine, Random  Result Value Ref Range Status   Specimen Description  Final    URINE, RANDOM Performed at [facility], [address] [place], [place] [address]    Special Requests   Final    NONE Reflexed from [id number] Performed at [facility], [address] [place], [place] [address]    Culture (A)  Final    >=100,000 COLONIES/mL GRAM NEGATIVE RODS 70,000 COLONIES/mL KLEBSIELLA PNEUMONIAE SUSCEPTIBILITIES TO FOLLOW Performed at [facility], [address] [place], [place] [address]    Report Status PENDING  Incomplete     Labs: CBC: Recent Labs  Lab 10/23/22 0743  WBC 4.1  HGB 12.5  HCT 38.8  MCV 85.1  PLT 149*   Basic Metabolic Panel: Recent Labs  Lab 10/23/22 0743 10/24/22 0330  NA 133* 135  K 3.9 4.0  CL 99 101  CO2 24 30  GLUCOSE 333* 112*  BUN 28* 32*  CREATININE 1.27* 1.20*  CALCIUM 8.2* 7.9*   Liver Function Tests: Recent Labs  Lab 10/23/22 0743  AST 40  ALT 21  ALKPHOS 72  BILITOT 0.1*  PROT 7.1  ALBUMIN 3.3*   CBG: Recent Labs  Lab 10/23/22 2016 10/24/22 0013 10/24/22 0439 10/24/22 0841 10/24/22 1202  GLUCAP 107* 117* 115* 108* 134*    Discharge time spent: greater than 30 minutes.  Signed: Marrion Coy, MD [facility] 10/24/2022

## 2022-10-24 NOTE — Plan of Care (Signed)
  Problem: Health Behavior/Discharge Planning: Goal: Ability to manage health-related needs will improve Outcome: Progressing   Problem: Clinical Measurements: Goal: Respiratory complications will improve Outcome: Progressing Goal: Cardiovascular complication will be avoided Outcome: Progressing   Problem: Nutrition: Goal: Adequate nutrition will be maintained Outcome: Progressing   Problem: Elimination: Goal: Will not experience complications related to bowel motility Outcome: Progressing Goal: Will not experience complications related to urinary retention Outcome: Progressing

## 2022-11-21 ENCOUNTER — Other Ambulatory Visit: Payer: Self-pay | Admitting: Family Medicine

## 2022-11-21 DIAGNOSIS — Z780 Asymptomatic menopausal state: Secondary | ICD-10-CM

## 2022-12-07 ENCOUNTER — Emergency Department: Payer: Medicare Other

## 2022-12-07 ENCOUNTER — Inpatient Hospital Stay
Admission: EM | Admit: 2022-12-07 | Discharge: 2022-12-10 | DRG: 871 | Disposition: A | Discharge status: Home / Self Care | Payer: Medicare Other | Attending: Internal Medicine | Admitting: Internal Medicine

## 2022-12-07 ENCOUNTER — Other Ambulatory Visit: Payer: Self-pay

## 2022-12-07 DIAGNOSIS — N183 Chronic kidney disease, stage 3 unspecified: Secondary | ICD-10-CM | POA: Diagnosis present

## 2022-12-07 DIAGNOSIS — Z85819 Personal history of malignant neoplasm of unspecified site of lip, oral cavity, and pharynx: Secondary | ICD-10-CM | POA: Diagnosis not present

## 2022-12-07 DIAGNOSIS — R1310 Dysphagia, unspecified: Secondary | ICD-10-CM | POA: Diagnosis present

## 2022-12-07 DIAGNOSIS — J189 Pneumonia, unspecified organism: Secondary | ICD-10-CM | POA: Diagnosis not present

## 2022-12-07 DIAGNOSIS — A419 Sepsis, unspecified organism: Principal | ICD-10-CM | POA: Diagnosis present

## 2022-12-07 DIAGNOSIS — I5032 Chronic diastolic (congestive) heart failure: Secondary | ICD-10-CM | POA: Diagnosis present

## 2022-12-07 DIAGNOSIS — Z6821 Body mass index (BMI) 21.0-21.9, adult: Secondary | ICD-10-CM | POA: Diagnosis not present

## 2022-12-07 DIAGNOSIS — Z8616 Personal history of COVID-19: Secondary | ICD-10-CM

## 2022-12-07 DIAGNOSIS — J180 Bronchopneumonia, unspecified organism: Secondary | ICD-10-CM | POA: Diagnosis present

## 2022-12-07 DIAGNOSIS — E876 Hypokalemia: Secondary | ICD-10-CM | POA: Diagnosis present

## 2022-12-07 DIAGNOSIS — E872 Acidosis, unspecified: Secondary | ICD-10-CM | POA: Diagnosis present

## 2022-12-07 DIAGNOSIS — R6520 Severe sepsis without septic shock: Secondary | ICD-10-CM | POA: Diagnosis present

## 2022-12-07 DIAGNOSIS — R54 Age-related physical debility: Secondary | ICD-10-CM | POA: Diagnosis present

## 2022-12-07 DIAGNOSIS — Z79890 Hormone replacement therapy (postmenopausal): Secondary | ICD-10-CM

## 2022-12-07 DIAGNOSIS — Z1152 Encounter for screening for COVID-19: Secondary | ICD-10-CM | POA: Diagnosis not present

## 2022-12-07 DIAGNOSIS — J9601 Acute respiratory failure with hypoxia: Principal | ICD-10-CM | POA: Diagnosis present

## 2022-12-07 DIAGNOSIS — Z7982 Long term (current) use of aspirin: Secondary | ICD-10-CM

## 2022-12-07 DIAGNOSIS — I130 Hypertensive heart and chronic kidney disease with heart failure and stage 1 through stage 4 chronic kidney disease, or unspecified chronic kidney disease: Secondary | ICD-10-CM | POA: Diagnosis present

## 2022-12-07 DIAGNOSIS — Z931 Gastrostomy status: Secondary | ICD-10-CM | POA: Diagnosis not present

## 2022-12-07 DIAGNOSIS — Z79899 Other long term (current) drug therapy: Secondary | ICD-10-CM

## 2022-12-07 DIAGNOSIS — I10 Essential (primary) hypertension: Secondary | ICD-10-CM | POA: Diagnosis present

## 2022-12-07 DIAGNOSIS — E039 Hypothyroidism, unspecified: Secondary | ICD-10-CM | POA: Diagnosis present

## 2022-12-07 DIAGNOSIS — Z8589 Personal history of malignant neoplasm of other organs and systems: Secondary | ICD-10-CM

## 2022-12-07 DIAGNOSIS — E440 Moderate protein-calorie malnutrition: Secondary | ICD-10-CM | POA: Diagnosis present

## 2022-12-07 LAB — URINALYSIS, ROUTINE W REFLEX MICROSCOPIC
Bilirubin Urine: NEGATIVE
Glucose, UA: NEGATIVE mg/dL
Hgb urine dipstick: NEGATIVE
Ketones, ur: NEGATIVE mg/dL
Leukocytes,Ua: NEGATIVE
Nitrite: NEGATIVE
Protein, ur: NEGATIVE mg/dL
Specific Gravity, Urine: 1.008 (ref 1.005–1.030)
pH: 8 (ref 5.0–8.0)

## 2022-12-07 LAB — COMPREHENSIVE METABOLIC PANEL
ALT: 21 U/L (ref 0–44)
AST: 35 U/L (ref 15–41)
Albumin: 3.9 g/dL (ref 3.5–5.0)
Alkaline Phosphatase: 88 U/L (ref 38–126)
Anion gap: 13 (ref 5–15)
BUN: 35 mg/dL — ABNORMAL HIGH (ref 8–23)
CO2: 32 mmol/L (ref 22–32)
Calcium: 9 mg/dL (ref 8.9–10.3)
Chloride: 92 mmol/L — ABNORMAL LOW (ref 98–111)
Creatinine, Ser: 1.18 mg/dL — ABNORMAL HIGH (ref 0.44–1.00)
GFR, Estimated: 45 mL/min — ABNORMAL LOW (ref >60)
Glucose, Bld: 148 mg/dL — ABNORMAL HIGH (ref 70–99)
Potassium: 3.1 mmol/L — ABNORMAL LOW (ref 3.5–5.1)
Sodium: 137 mmol/L (ref 135–145)
Total Bilirubin: 0.7 mg/dL (ref 0.3–1.2)
Total Protein: 7.8 g/dL (ref 6.5–8.1)

## 2022-12-07 LAB — CBC WITH DIFFERENTIAL/PLATELET
Abs Immature Granulocytes: 0.03 10*3/uL (ref 0.00–0.07)
Basophils Absolute: 0.1 10*3/uL (ref 0.0–0.1)
Basophils Relative: 1 %
Eosinophils Absolute: 0.1 10*3/uL (ref 0.0–0.5)
Eosinophils Relative: 2 %
HCT: 40 % (ref 36.0–46.0)
Hemoglobin: 12.6 g/dL (ref 12.0–15.0)
Immature Granulocytes: 1 %
Lymphocytes Relative: 34 %
Lymphs Abs: 1.8 10*3/uL (ref 0.7–4.0)
MCH: 27 pg (ref 26.0–34.0)
MCHC: 31.5 g/dL (ref 30.0–36.0)
MCV: 85.8 fL (ref 80.0–100.0)
Monocytes Absolute: 0.7 10*3/uL (ref 0.1–1.0)
Monocytes Relative: 13 %
Neutro Abs: 2.6 10*3/uL (ref 1.7–7.7)
Neutrophils Relative %: 49 %
Platelets: 190 10*3/uL (ref 150–400)
RBC: 4.66 MIL/uL (ref 3.87–5.11)
RDW: 14.5 % (ref 11.5–15.5)
WBC: 5.3 10*3/uL (ref 4.0–10.5)
nRBC: 0 % (ref 0.0–0.2)

## 2022-12-07 LAB — RESP PANEL BY RT-PCR (RSV, FLU A&B, COVID)  RVPGX2
Influenza A by PCR: NEGATIVE
Influenza B by PCR: NEGATIVE
Resp Syncytial Virus by PCR: NEGATIVE
SARS Coronavirus 2 by RT PCR: NEGATIVE

## 2022-12-07 LAB — CBG MONITORING, ED: Glucose-Capillary: 135 mg/dL — ABNORMAL HIGH (ref 70–99)

## 2022-12-07 LAB — BRAIN NATRIURETIC PEPTIDE: B Natriuretic Peptide: 292.2 pg/mL — ABNORMAL HIGH (ref 0.0–100.0)

## 2022-12-07 MED ORDER — LACTATED RINGERS IV SOLN: INTRAVENOUS | Status: AC

## 2022-12-07 MED ORDER — ONDANSETRON HCL 4 MG/2ML IJ SOLN
4.0000 mg | Freq: Four times a day (QID) | INTRAMUSCULAR | Status: DC | PRN
  Administered 2022-12-08: 4 mg via INTRAVENOUS
  Filled 2022-12-07: qty 2

## 2022-12-07 MED ORDER — ASPIRIN 81 MG PO TBEC
81.0000 mg | DELAYED_RELEASE_TABLET | Freq: Every evening | ORAL | Status: DC
  Administered 2022-12-08: 81 mg via ORAL
  Filled 2022-12-07 (×2): qty 1

## 2022-12-07 MED ORDER — LACTATED RINGERS IV BOLUS
1000.0000 mL | Freq: Once | INTRAVENOUS | Status: AC
  Administered 2022-12-07: 1000 mL via INTRAVENOUS

## 2022-12-07 MED ORDER — METOPROLOL SUCCINATE ER 25 MG PO TB24
25.0000 mg | ORAL_TABLET | Freq: Every morning | ORAL | Status: DC
  Administered 2022-12-08 – 2022-12-09 (×2): 25 mg via ORAL
  Filled 2022-12-07 (×3): qty 1

## 2022-12-07 MED ORDER — ONDANSETRON HCL 4 MG PO TABS: 4.0000 mg | ORAL_TABLET | Freq: Four times a day (QID) | ORAL | Status: DC | PRN

## 2022-12-07 MED ORDER — LACTATED RINGERS IV SOLN: 150.0000 mL/h | INTRAVENOUS | Status: DC

## 2022-12-07 MED ORDER — TRAZODONE HCL 50 MG PO TABS
25.0000 mg | ORAL_TABLET | Freq: Every evening | ORAL | Status: DC | PRN
  Administered 2022-12-08: 25 mg via ORAL
  Filled 2022-12-07: qty 1

## 2022-12-07 MED ORDER — SODIUM CHLORIDE 0.9 % IV SOLN
2.0000 g | INTRAVENOUS | Status: DC
  Administered 2022-12-08 – 2022-12-09 (×2): 2 g via INTRAVENOUS
  Filled 2022-12-07 (×2): qty 20

## 2022-12-07 MED ORDER — IOHEXOL 350 MG/ML SOLN
75.0000 mL | Freq: Once | INTRAVENOUS | Status: AC | PRN
  Administered 2022-12-07: 75 mL via INTRAVENOUS

## 2022-12-07 MED ORDER — MAGNESIUM HYDROXIDE 400 MG/5ML PO SUSP: 30.0000 mL | Freq: Every day | ORAL | Status: DC | PRN

## 2022-12-07 MED ORDER — SODIUM CHLORIDE 0.9 % IV SOLN
1.0000 g | Freq: Once | INTRAVENOUS | Status: AC
  Administered 2022-12-07: 1 g via INTRAVENOUS
  Filled 2022-12-07: qty 10

## 2022-12-07 MED ORDER — ACETAMINOPHEN 325 MG PO TABS: 650.0000 mg | ORAL_TABLET | Freq: Four times a day (QID) | ORAL | Status: DC | PRN

## 2022-12-07 MED ORDER — ACETAMINOPHEN 650 MG RE SUPP: 650.0000 mg | Freq: Four times a day (QID) | RECTAL | Status: DC | PRN

## 2022-12-07 MED ORDER — SODIUM CHLORIDE 0.9 % IV SOLN
500.0000 mg | Freq: Once | INTRAVENOUS | Status: DC
  Filled 2022-12-07: qty 5

## 2022-12-07 MED ORDER — SODIUM CHLORIDE 0.9 % IV SOLN
500.0000 mg | INTRAVENOUS | Status: DC
  Administered 2022-12-08 (×2): 500 mg via INTRAVENOUS
  Filled 2022-12-07: qty 5

## 2022-12-07 MED ORDER — LEVOTHYROXINE SODIUM 50 MCG PO TABS
125.0000 ug | ORAL_TABLET | Freq: Every day | ORAL | Status: DC
  Administered 2022-12-09 – 2022-12-10 (×2): 125 ug via ORAL
  Filled 2022-12-07: qty 3
  Filled 2022-12-07 (×2): qty 1

## 2022-12-07 MED ORDER — LACTATED RINGERS IV BOLUS (SEPSIS)
1000.0000 mL | Freq: Once | INTRAVENOUS | Status: AC
  Administered 2022-12-07: 1000 mL via INTRAVENOUS

## 2022-12-07 MED ORDER — POTASSIUM CHLORIDE 20 MEQ PO PACK
40.0000 meq | PACK | Freq: Once | ORAL | Status: AC
  Administered 2022-12-07: 40 meq
  Filled 2022-12-07: qty 2

## 2022-12-07 MED ORDER — AMLODIPINE BESYLATE 10 MG PO TABS
10.0000 mg | ORAL_TABLET | Freq: Every day | ORAL | Status: DC
  Administered 2022-12-08 – 2022-12-09 (×2): 10 mg via ORAL
  Filled 2022-12-07 (×3): qty 1

## 2022-12-07 MED ORDER — ENOXAPARIN SODIUM 30 MG/0.3ML IJ SOSY: 30.0000 mg | PREFILLED_SYRINGE | INTRAMUSCULAR | Status: DC

## 2022-12-07 NOTE — ED Notes (Signed)
Pt's granddaughter's state the pt gets anxious when she feels the BP machine cycling and that causes her HR to increase and BP.

## 2022-12-07 NOTE — ED Notes (Signed)
Per pt request and consent, called pt daughter Stanton Kidney and updated on pt status.

## 2022-12-07 NOTE — ED Notes (Signed)
Patient transported to CT 

## 2022-12-07 NOTE — ED Notes (Signed)
PT placed on O2 4LPM via [place] by provider Wells due to desat in the 80's

## 2022-12-07 NOTE — ED Triage Notes (Signed)
Pt BIB EMS, pt was outside a restaurant with family and became dizzy while standing up. Pt was helped onto a chair, no fall, LOC, SHOB, or CP. Pt receives G tube feeds, last was around 1430,has not missed any feeds. CBG in the field 173. Provider Wells at bedside on arrival, pt is AAOX4, NAD.  HX throat CA

## 2022-12-07 NOTE — ED Notes (Signed)
CBG on arrival 135

## 2022-12-07 NOTE — Progress Notes (Signed)
CODE SEPSIS - PHARMACY COMMUNICATION  **Broad Spectrum Antibiotics should be administered within 1 hour of Sepsis diagnosis**  Time Code Sepsis Called/Page Received: 2309  Antibiotics Ordered: Azithromycin & Ceftriaxone  Time of 1st antibiotic administration: 2323  Otelia Sergeant, PharmD, [facility] 12/07/2022 11:34 PM

## 2022-12-07 NOTE — H&P (Addendum)
[place]   PATIENT NAME: Anna Lara    MR#:  [id number]  DATE OF BIRTH:  September 24, 1933  DATE OF ADMISSION:  12/07/2022  PRIMARY CARE PHYSICIAN: Leanna Sato, MD   Patient is coming from: Home  REQUESTING/REFERRING PHYSICIAN: Claudell Kyle, MD  CHIEF COMPLAINT:   Chief Complaint  Patient presents with   Dizziness    HISTORY OF PRESENT ILLNESS:  Anna Lara is a 89 y.o. African-American female with medical history significant for hypertension, stage III chronic kidney disease, diastolic heart failure, hypothyroidism, dysphagia status post G-tube placement and vertigo, who presented to the emergency room with acute onset of near syncope when she was trying to get up and get lightheaded and dizzy then had a brief near syncope with lightheadedness that lasted less than 2 minutes.  She denied losing consciousness.  No chest pain or palpitations.  She has been having cough productive of yellowish sputum over the last 4 weeks.  Prior to that she had COVID-19 infection.  She denied any wheezing.  No dysuria, oliguria or hematuria or flank pain.  No nausea or vomiting or abdominal pain.  No bleeding diathesis.  ED Course: When patient came to the ER, BP was 189/107 with respiratory to 23 and otherwise normal vital signs later on pulse oximetry was down to 88% on room air and her heart rate was up to 103.  On 4 L of O2 by nasal cannula pulse oximetry was up to 96% and later 100%.  Labs revealed hypokalemia of 3.1 and a chloride of 92 with a blood glucose of 148, BUN of 35 and creatinine 1.18.  BNP was 292.2 and CBC was within normal.  Respiratory panel came back negative including COVID-19, RSV and influenza A and B.  UA was negative. EKG as reviewed by me : EKG showed sinus rhythm with rate of 93 with multiple PVCs and EACs and T wave inversion with ST segment depression laterally.  Portable chest x-ray showed no acute cardiopulmonary disease. Imaging: Portable chest x-ray  showed no acute cardiopulmonary disease.  Chest CTA revealed t no evidence for PE but showed bronchopneumonia most prominent within the right lower lobe and stable ascending thoracic aortic aneurysm of 4 cm with recommendation for annual imaging follow-up by CTA or MRA.  It showed aortic atherosclerosis and aortic valve leaflet calcifications and cardiomegaly.  The patient was given IV Rocephin and Zithromax, 2 L bolus of IV lactated ringer and 40 mill equivalent p.o. potassium chloride.  She will be admitted to a medical telemetry bed for further evaluation and management.  PAST MEDICAL HISTORY:   Past Medical History:  Diagnosis Date   Cancer (HCC)    throat cancer   Heart murmur    Hypertension    Thyroid disease    Vertigo     PAST SURGICAL HISTORY:   Past Surgical History:  Procedure Laterality Date   GASTROSTOMY TUBE PLACEMENT      SOCIAL HISTORY:   Social History   Tobacco Use   Smoking status: Never   Smokeless tobacco: Never  Substance Use Topics   Alcohol use: No    FAMILY HISTORY:  No family history on file. No pertinent familial diseases. DRUG ALLERGIES:  No Known Allergies  REVIEW OF SYSTEMS:   ROS As per history of present illness. All pertinent systems were reviewed above. Constitutional, HEENT, cardiovascular, respiratory, GI, GU, musculoskeletal, neuro, psychiatric, endocrine, integumentary and hematologic systems were reviewed and are otherwise negative/unremarkable except for  positive findings mentioned above in the HPI.   MEDICATIONS AT HOME:   Prior to Admission medications   Medication Sig Start Date End Date Taking? Authorizing Provider  amLODipine (NORVASC) 10 MG tablet Take 10 mg by mouth daily.   Yes [provider]  aspirin EC 81 MG tablet Take 81 mg by mouth every evening.   Yes [provider]  levothyroxine (SYNTHROID) 125 MCG tablet Take 125 mcg by mouth every morning.   Yes [provider]  metoprolol  succinate (TOPROL-XL) 25 MG 24 hr tablet Take 25 mg by mouth every morning.   Yes [provider]  furosemide (LASIX) 20 MG tablet Take 1 tablet (20 mg total) by mouth daily. 10/24/22 10/24/23  Marrion Coy, MD      VITAL SIGNS:  Blood pressure (!) 160/87, pulse 63, temperature 98.2 F (36.8 C), temperature source Oral, resp. rate (!) 30, SpO2 96%.  PHYSICAL EXAMINATION:  Physical Exam  GENERAL:  89 y.o.-year-old patient lying in the bed with mild respiratory distress with conversational dyspnea. EYES: Pupils equal, round, reactive to light and accommodation. No scleral icterus. Extraocular muscles intact.  HEENT: Head atraumatic, normocephalic. Oropharynx and nasopharynx clear.  NECK:  Supple, no jugular venous distention. No thyroid enlargement, no tenderness.  LUNGS: Diminished right middle lung zone breath sounds with associated crackles.  No use of accessory muscles of respiration.  CARDIOVASCULAR: Regular rate and rhythm, S1, S2 normal. No murmurs, rubs, or gallops.  ABDOMEN: Soft, nondistended, nontender. Bowel sounds present. No organomegaly or mass.  G-tube in place. EXTREMITIES: No pedal edema, cyanosis, or clubbing.  NEUROLOGIC: Cranial nerves II through XII are intact. Muscle strength 5/5 in all extremities. Sensation intact. Gait not checked.  PSYCHIATRIC: The patient is alert and oriented x 3.  Normal affect and good eye contact. SKIN: No obvious rash, lesion, or ulcer.   LABORATORY PANEL:   CBC Recent Labs  Lab 12/07/22 1920  WBC 5.3  HGB 12.6  HCT 40.0  PLT 190   ------------------------------------------------------------------------------------------------------------------  Chemistries  Recent Labs  Lab 12/07/22 1920  NA 137  K 3.1*  CL 92*  CO2 32  GLUCOSE 148*  BUN 35*  CREATININE 1.18*  CALCIUM 9.0  AST 35  ALT 21  ALKPHOS 88  BILITOT 0.7    ------------------------------------------------------------------------------------------------------------------  Cardiac Enzymes No results for input(s): "TROPONINI" in the last 168 hours. ------------------------------------------------------------------------------------------------------------------  RADIOLOGY:  CT Angio Chest PE W and/or Wo Contrast  Result Date: 12/07/2022 CLINICAL DATA:  acute hypoxia, tachycardia, negative CXR and viral swabs, concern for PE. syncopal episode today EXAM: CT ANGIOGRAPHY CHEST WITH CONTRAST TECHNIQUE: Multidetector CT imaging of the chest was performed using the standard protocol during bolus administration of intravenous contrast. Multiplanar CT image reconstructions and MIPs were obtained to evaluate the vascular anatomy. RADIATION DOSE REDUCTION: This exam was performed according to the departmental dose-optimization program which includes automated exposure control, adjustment of the mA and/or kV according to patient size and/or use of iterative reconstruction technique. CONTRAST:  75mL OMNIPAQUE IOHEXOL 350 MG/ML SOLN COMPARISON:  CT angiography chest 10/23/2022 FINDINGS: Cardiovascular: Satisfactory opacification of the pulmonary arteries to the segmental level. No evidence of pulmonary embolism. Enlarged heart size. No significant pericardial effusion. The ascending thoracic aorta measures up to 4 cm-stable. Descending thoracic aorta is normal in caliber. At least moderate atherosclerotic plaque of the thoracic aorta. No coronary artery calcifications. Aortic valve leaflet calcification. Mediastinum/Nodes: No enlarged mediastinal, hilar, or axillary lymph nodes. Thyroid gland, trachea, and esophagus demonstrate no  significant findings. Lungs/Pleura: Biapical pleural/pulmonary scarring. Interval development of diffuse mild bronchial wall thickening and peribronchovascular ground-glass airspace opacities. Findings most prominent within the right middle  lobe. No pulmonary nodule. No pulmonary mass. No pleural effusion. No pneumothorax. Upper Abdomen: Gastrostomy tube with tip terminating within the gastric lumen. Inflated balloon not visualized. Musculoskeletal: No chest wall abnormality. No suspicious lytic or blastic osseous lesions. No acute displaced fracture. Multilevel degenerative changes of the spine. Review of the MIP images confirms the above findings. IMPRESSION: 1. No pulmonary embolus. 2. Pulmonary findings suggestive of bronchopneumonia most prominent within the right middle lobe. 3. Stable aneurysmal ascending thoracic aorta (4 cm). Recommend annual imaging followup by CTA or MRA. This recommendation follows 2010 ACCF/AHA/AATS/ACR/ASA/SCA/SCAI/SIR/STS/SVM Guidelines for the Diagnosis and Management of Patients with Thoracic Aortic Disease. Circulation. 2010; 121: J478-G956. Aortic aneurysm NOS (ICD10-I71.9). 4. Aortic Atherosclerosis (ICD10-I70.0) including aortic valve leaflet calcifications-correlate for aortic stenosis. 5. Cardiomegaly. Electronically Signed   By: Tish Frederickson M.D.   On: 12/07/2022 22:51   DG Chest Portable 1 View  Result Date: 12/07/2022 CLINICAL DATA:  Shortness of breath, hypoxia EXAM: PORTABLE CHEST - 1 VIEW COMPARISON:  10/23/2022 FINDINGS: Lungs are clear. Heart size and mediastinal contours are within normal limits. Aortic Atherosclerosis (ICD10-170.0). No effusion. Visualized bones unremarkable. IMPRESSION: No acute cardiopulmonary disease. Electronically Signed   By: Corlis Leak M.D.   On: 12/07/2022 20:51      IMPRESSION AND PLAN:  Assessment and Plan: * Bronchopneumonia - The patient will be admitted to a medical telemetry bed. - Will continue antibiotic therapy with IV Rocephin and Zithromax. - Mucolytic therapy be provided as well as duo nebs q.i.d. and q.4 hours p.r.n. - We will follow blood cultures.   Sepsis due to pneumonia [facility]) - The patient will be placed on IV Rocephin and Zithromax as  mentioned above. - We will continue hydration with IV lactated ringer. - Will follow blood cultures.  Acute respiratory failure with hypoxia (HCC) - This is clearly secondary to #1. - O2 protocol will be followed. - Management otherwise as above.  Hypokalemia - Potassium will be replaced and magnesium level will be checked.  Hypothyroidism - We will continue Synthroid.  Essential hypertension - We will continue Toprol-XL and amlodipine.   DVT prophylaxis: Lovenox.  Advanced Care Planning:  Code Status: full code.  Family Communication:  The plan of care was discussed in details with the patient (and family). I answered all questions. The patient agreed to proceed with the above mentioned plan. Further management will depend upon hospital course. Disposition Plan: Back to previous home environment Consults called: none.  All the records are reviewed and case discussed with ED provider.  Status is: Inpatient  At the time of the admission, it appears that the appropriate admission status for this patient is inpatient.  This is judged to be reasonable and necessary in order to provide the required intensity of service to ensure the patient's safety given the presenting symptoms, physical exam findings and initial radiographic and laboratory data in the context of comorbid conditions.  The patient requires inpatient status due to high intensity of service, high risk of further deterioration and high frequency of surveillance required.  I certify that at the time of admission, it is my clinical judgment that the patient will require inpatient hospital care extending more than 2 midnights.  Dispo: The patient is from: Home              Anticipated d/c is to: Home              Patient currently is not medically stable to d/c.              Difficult to place patient: No  Hannah Beat M.D on 12/08/2022 at 12:11 AM  [facility]   From 7 PM-7 AM, contact  night-coverage [email or web address]  CC: Primary care physician; Leanna Sato, MD

## 2022-12-07 NOTE — H&P (Incomplete)
[place]   PATIENT NAME: Anna Lara    MR#:  [id number]  DATE OF BIRTH:  Jul 28, 1933  DATE OF ADMISSION:  12/07/2022  PRIMARY CARE PHYSICIAN: Leanna Sato, MD   Patient is coming from: Home  REQUESTING/REFERRING PHYSICIAN: Claudell Kyle, MD  CHIEF COMPLAINT:   Chief Complaint  Patient presents with  . Dizziness    HISTORY OF PRESENT ILLNESS:  KATHERENE SARTINI is a 89 y.o. African-American female with medical history significant for hypertension, stage III chronic kidney disease, diastolic heart failure, hypothyroidism, dysphagia status post G-tube placement and vertigo, who presented to the emergency room with acute onset of near syncope when she was trying to get up and get lightheaded and dizzy then had a brief near syncope with lightheadedness that lasted less than 2 minutes.  She denied losing consciousness.  No chest pain or palpitations.  She has been having cough productive of yellowish sputum over the last 4 weeks.  Prior to that she had COVID-19 infection.  She denied any wheezing.  No dysuria, oliguria or hematuria or flank pain.  No nausea or vomiting or abdominal pain.  No bleeding diathesis.  ED Course: When patient came to the ER, BP was 189/107 with respiratory to 23 and otherwise normal vital signs later on pulse oximetry was down to 88% on room air and her heart rate was up to 103.  On 4 L of O2 by nasal cannula pulse oximetry was up to 96% and later 100%.  Labs revealed hypokalemia of 3.1 and a chloride of 92 with a blood glucose of 148, BUN of 35 and creatinine 1.18.  BNP was 292.2 and CBC was within normal.  Respiratory panel came back negative including COVID-19, RSV and influenza A and B.  UA was negative. EKG as reviewed by me : EKG showed sinus rhythm with rate of 93 with multiple PVCs and EACs and T wave inversion with ST segment depression laterally.  Portable chest x-ray showed no acute cardiopulmonary disease. Imaging: Portable chest x-ray  showed no acute cardiopulmonary disease.  Chest CTA revealed t no evidence for PE but showed bronchopneumonia most prominent within the right lower lobe and stable ascending thoracic aortic aneurysm of 4 cm with recommendation for annual imaging follow-up by CTA or MRA.  It showed aortic atherosclerosis and aortic valve leaflet calcifications and cardiomegaly.  The patient was given IV Rocephin and Zithromax, 2 L bolus of IV lactated ringer and 40 mill equivalent p.o. potassium chloride.  She will be admitted to a medical telemetry bed for further evaluation and management.  PAST MEDICAL HISTORY:   Past Medical History:  Diagnosis Date  . Cancer (HCC)    throat cancer  . Heart murmur   . Hypertension   . Thyroid disease   . Vertigo     PAST SURGICAL HISTORY:   Past Surgical History:  Procedure Laterality Date  . GASTROSTOMY TUBE PLACEMENT      SOCIAL HISTORY:   Social History   Tobacco Use  . Smoking status: Never  . Smokeless tobacco: Never  Substance Use Topics  . Alcohol use: No    FAMILY HISTORY:  No family history on file. No pertinent familial diseases. DRUG ALLERGIES:  No Known Allergies  REVIEW OF SYSTEMS:   ROS As per history of present illness. All pertinent systems were reviewed above. Constitutional, HEENT, cardiovascular, respiratory, GI, GU, musculoskeletal, neuro, psychiatric, endocrine, integumentary and hematologic systems were reviewed and are otherwise negative/unremarkable except for  positive findings mentioned above in the HPI.   MEDICATIONS AT HOME:   Prior to Admission medications   Medication Sig Start Date End Date Taking? Authorizing Provider  amLODipine (NORVASC) 10 MG tablet Take 10 mg by mouth daily.   Yes [provider]  aspirin EC 81 MG tablet Take 81 mg by mouth every evening.   Yes [provider]  levothyroxine (SYNTHROID) 125 MCG tablet Take 125 mcg by mouth every morning.   Yes [provider]   metoprolol succinate (TOPROL-XL) 25 MG 24 hr tablet Take 25 mg by mouth every morning.   Yes [provider]  furosemide (LASIX) 20 MG tablet Take 1 tablet (20 mg total) by mouth daily. 10/24/22 10/24/23  Marrion Coy, MD      VITAL SIGNS:  Blood pressure (!) 160/87, pulse 63, temperature 98.2 F (36.8 C), temperature source Oral, resp. rate (!) 30, SpO2 96%.  PHYSICAL EXAMINATION:  Physical Exam  GENERAL:  89 y.o.-year-old patient lying in the bed with mild respiratory distress with conversational dyspnea. EYES: Pupils equal, round, reactive to light and accommodation. No scleral icterus. Extraocular muscles intact.  HEENT: Head atraumatic, normocephalic. Oropharynx and nasopharynx clear.  NECK:  Supple, no jugular venous distention. No thyroid enlargement, no tenderness.  LUNGS: Diminished right middle lung zone breath sounds with associated crackles.  No use of accessory muscles of respiration.  CARDIOVASCULAR: Regular rate and rhythm, S1, S2 normal. No murmurs, rubs, or gallops.  ABDOMEN: Soft, nondistended, nontender. Bowel sounds present. No organomegaly or mass.  EXTREMITIES: No pedal edema, cyanosis, or clubbing.  NEUROLOGIC: Cranial nerves II through XII are intact. Muscle strength 5/5 in all extremities. Sensation intact. Gait not checked.  PSYCHIATRIC: The patient is alert and oriented x 3.  Normal affect and good eye contact. SKIN: No obvious rash, lesion, or ulcer.   LABORATORY PANEL:   CBC Recent Labs  Lab 12/07/22 1920  WBC 5.3  HGB 12.6  HCT 40.0  PLT 190   ------------------------------------------------------------------------------------------------------------------  Chemistries  Recent Labs  Lab 12/07/22 1920  NA 137  K 3.1*  CL 92*  CO2 32  GLUCOSE 148*  BUN 35*  CREATININE 1.18*  CALCIUM 9.0  AST 35  ALT 21  ALKPHOS 88  BILITOT 0.7    ------------------------------------------------------------------------------------------------------------------  Cardiac Enzymes No results for input(s): "TROPONINI" in the last 168 hours. ------------------------------------------------------------------------------------------------------------------  RADIOLOGY:  CT Angio Chest PE W and/or Wo Contrast  Result Date: 12/07/2022 CLINICAL DATA:  acute hypoxia, tachycardia, negative CXR and viral swabs, concern for PE. syncopal episode today EXAM: CT ANGIOGRAPHY CHEST WITH CONTRAST TECHNIQUE: Multidetector CT imaging of the chest was performed using the standard protocol during bolus administration of intravenous contrast. Multiplanar CT image reconstructions and MIPs were obtained to evaluate the vascular anatomy. RADIATION DOSE REDUCTION: This exam was performed according to the departmental dose-optimization program which includes automated exposure control, adjustment of the mA and/or kV according to patient size and/or use of iterative reconstruction technique. CONTRAST:  75mL OMNIPAQUE IOHEXOL 350 MG/ML SOLN COMPARISON:  CT angiography chest 10/23/2022 FINDINGS: Cardiovascular: Satisfactory opacification of the pulmonary arteries to the segmental level. No evidence of pulmonary embolism. Enlarged heart size. No significant pericardial effusion. The ascending thoracic aorta measures up to 4 cm-stable. Descending thoracic aorta is normal in caliber. At least moderate atherosclerotic plaque of the thoracic aorta. No coronary artery calcifications. Aortic valve leaflet calcification. Mediastinum/Nodes: No enlarged mediastinal, hilar, or axillary lymph nodes. Thyroid gland, trachea, and esophagus demonstrate no significant findings. Lungs/Pleura:  Biapical pleural/pulmonary scarring. Interval development of diffuse mild bronchial wall thickening and peribronchovascular ground-glass airspace opacities. Findings most prominent within the right middle  lobe. No pulmonary nodule. No pulmonary mass. No pleural effusion. No pneumothorax. Upper Abdomen: Gastrostomy tube with tip terminating within the gastric lumen. Inflated balloon not visualized. Musculoskeletal: No chest wall abnormality. No suspicious lytic or blastic osseous lesions. No acute displaced fracture. Multilevel degenerative changes of the spine. Review of the MIP images confirms the above findings. IMPRESSION: 1. No pulmonary embolus. 2. Pulmonary findings suggestive of bronchopneumonia most prominent within the right middle lobe. 3. Stable aneurysmal ascending thoracic aorta (4 cm). Recommend annual imaging followup by CTA or MRA. This recommendation follows 2010 ACCF/AHA/AATS/ACR/ASA/SCA/SCAI/SIR/STS/SVM Guidelines for the Diagnosis and Management of Patients with Thoracic Aortic Disease. Circulation. 2010; 121: K160-F093. Aortic aneurysm NOS (ICD10-I71.9). 4. Aortic Atherosclerosis (ICD10-I70.0) including aortic valve leaflet calcifications-correlate for aortic stenosis. 5. Cardiomegaly. Electronically Signed   By: Tish Frederickson M.D.   On: 12/07/2022 22:51   DG Chest Portable 1 View  Result Date: 12/07/2022 CLINICAL DATA:  Shortness of breath, hypoxia EXAM: PORTABLE CHEST - 1 VIEW COMPARISON:  10/23/2022 FINDINGS: Lungs are clear. Heart size and mediastinal contours are within normal limits. Aortic Atherosclerosis (ICD10-170.0). No effusion. Visualized bones unremarkable. IMPRESSION: No acute cardiopulmonary disease. Electronically Signed   By: Corlis Leak M.D.   On: 12/07/2022 20:51      IMPRESSION AND PLAN:  Assessment and Plan: No notes have been filed under this hospital service. Service: Hospitalist      DVT prophylaxis: Lovenox***  Advanced Care Planning:  Code Status: full code***  Family Communication:  The plan of care was discussed in details with the patient (and family). I answered all questions. The patient agreed to proceed with the above mentioned plan. Further  management will depend upon hospital course. Disposition Plan: Back to previous home environment Consults called: none***  All the records are reviewed and case discussed with ED provider.  Status is: Inpatient {Inpatient:23812}   At the time of the admission, it appears that the appropriate admission status for this patient is inpatient.  This is judged to be reasonable and necessary in order to provide the required intensity of service to ensure the patient's safety given the presenting symptoms, physical exam findings and initial radiographic and laboratory data in the context of comorbid conditions.  The patient requires inpatient status due to high intensity of service, high risk of further deterioration and high frequency of surveillance required.  I certify that at the time of admission, it is my clinical judgment that the patient will require inpatient hospital care extending more than 2 midnights.                            Dispo: The patient is from: Home              Anticipated d/c is to: Home              Patient currently is not medically stable to d/c.              Difficult to place patient: No  Hannah Beat M.D on 12/07/2022 at 11:51 PM  [facility]   From 7 PM-7 AM, contact night-coverage [email or web address]  CC: Primary care physician; Leanna Sato, MD

## 2022-12-07 NOTE — ED Notes (Signed)
 Provided urine incontinence care. Replaced bed linen, bed pads, pt gown, and blankets. Pt repositioned, side rails up x2,  and call light placed back within reach. No further needs identified at this time.

## 2022-12-07 NOTE — Progress Notes (Signed)
Pt being followed by [facility] for Sepsis protocol. 

## 2022-12-07 NOTE — ED Notes (Signed)
ED Provider at bedside. 

## 2022-12-07 NOTE — ED Notes (Signed)
Hospitalist at bedside 

## 2022-12-07 NOTE — ED Provider Notes (Signed)
[facility] Provider Note    Event Date/Time   First MD Initiated Contact with Patient 12/07/22 1910     (approximate)   History   Dizziness   HPI Anna Lara is a 89 y.o. female with HTN, hypothyroidism, CKD stage III, diastolic heart failure presenting today for lightheadedness.  Patient was at her sisters birthday party today and when standing up noticed a brief spell lasting less than 2 minutes where she felt lightheaded.  She did not lose consciousness at any point.  Symptoms resolved but family did call EMS for further evaluation.  She denied dizziness symptoms, chest pain, shortness of breath, palpitations, nausea, sweating.  No recent illnesses or infections.  No other shortness of breath, abdominal pain recently.  Denies dysuria.  Patient believes she may be slightly dehydrated at this time.  Otherwise, at this moment has no acute complaints.  Reviewing recent chart notes, patient did have admission for COVID back in early August but has been feeling well since leaving the hospital at that time.     Physical Exam   Triage Vital Signs: ED Triage Vitals  Encounter Vitals Group     BP      Systolic BP Percentile      Diastolic BP Percentile      Pulse      Resp      Temp      Temp src      SpO2      Weight      Height      Head Circumference      Peak Flow      Pain Score      Pain Loc      Pain Education      Exclude from Growth Chart     Most recent vital signs: Vitals:   12/07/22 2100 12/07/22 2130  BP: (!) 169/98 (!) 160/87  Pulse: 98 63  Resp: (!) 26 (!) 30  Temp:    SpO2: 92% 96%    Physical Exam: I have reviewed the vital signs and nursing notes. General: Awake, alert, no acute distress.  Nontoxic appearing. Head:  Atraumatic, normocephalic.   ENT:  EOM intact, PERRL. Oral mucosa is pink and moist with no lesions.  Hoarse voice secondary to prior history of throat cancer. Neck: Neck is supple with full range of  motion, No meningeal signs. Cardiovascular:  RRR, No murmurs. Peripheral pulses palpable and equal bilaterally. Respiratory:  Symmetrical chest wall expansion.  No rhonchi, rales, or wheezes.  Good air movement throughout.  No use of accessory muscles.   Musculoskeletal:  No cyanosis or edema. Moving extremities with full ROM Abdomen:  Soft, nontender, nondistended.  Chronic G-tube present in abdomen. Neuro:  GCS 15, moving all four extremities, interacting appropriately. Speech clear. Psych:  Calm, appropriate.   Skin:  Warm, dry, no rash.    ED Results / Procedures / Treatments   Labs (all labs ordered are listed, but only abnormal results are displayed) Labs Reviewed  COMPREHENSIVE METABOLIC PANEL - Abnormal; Notable for the following components:      Result Value   Potassium 3.1 (*)    Chloride 92 (*)    Glucose, Bld 148 (*)    BUN 35 (*)    Creatinine, Ser 1.18 (*)    GFR, Estimated 45 (*)    All other components within normal limits  BRAIN NATRIURETIC PEPTIDE - Abnormal; Notable for the following components:   B Natriuretic Peptide  292.2 (*)    All other components within normal limits  URINALYSIS, ROUTINE W REFLEX MICROSCOPIC - Abnormal; Notable for the following components:   Color, Urine YELLOW (*)    APPearance CLEAR (*)    All other components within normal limits  CBG MONITORING, ED - Abnormal; Notable for the following components:   Glucose-Capillary 135 (*)    All other components within normal limits  RESP PANEL BY RT-PCR (RSV, FLU A&B, COVID)  RVPGX2  CULTURE, BLOOD (SINGLE)  CULTURE, BLOOD (SINGLE)  CBC WITH DIFFERENTIAL/PLATELET     EKG My EKG interpretation: Rate of 93, normal sinus rhythm with frequent PACs.  Right axis deviation.  No acute ST elevation or depression  RADIOLOGY Independently interpreted chest x-ray with no acute pathology.  CTA chest shows no evidence of PE but does show right-sided bronchopneumonia.   PROCEDURES:  Critical Care  performed: Yes, see critical care procedure note(s)  .Critical Care  Performed by: Janith Lima, MD Authorized by: Janith Lima, MD   Critical care provider statement:    Critical care time (minutes):  35   Critical care was necessary to treat or prevent imminent or life-threatening deterioration of the following conditions:  Sepsis and respiratory failure   Critical care was time spent personally by me on the following activities:  Development of treatment plan with patient or surrogate, discussions with consultants, evaluation of patient's response to treatment, examination of patient, ordering and review of laboratory studies, ordering and review of radiographic studies, ordering and performing treatments and interventions, pulse oximetry, re-evaluation of patient's condition and review of old charts   Care discussed with: admitting provider      MEDICATIONS ORDERED IN ED: Medications  cefTRIAXone (ROCEPHIN) 1 g in sodium chloride 0.9 % 100 mL IVPB (1 g Intravenous New Bag/Given 12/07/22 2323)  azithromycin (ZITHROMAX) 500 mg in sodium chloride 0.9 % 250 mL IVPB (has no administration in time range)  lactated ringers infusion ( Intravenous New Bag/Given 12/07/22 2325)  lactated ringers bolus 1,000 mL (1,000 mLs Intravenous New Bag/Given 12/07/22 2322)  lactated ringers bolus 1,000 mL (0 mLs Intravenous Stopped 12/07/22 2020)  potassium chloride (KLOR-CON) packet 40 mEq (40 mEq Per Tube Given 12/07/22 2021)  iohexol (OMNIPAQUE) 350 MG/ML injection 75 mL (75 mLs Intravenous Contrast Given 12/07/22 2153)     IMPRESSION / MDM / ASSESSMENT AND PLAN / ED COURSE  I reviewed the triage vital signs and the nursing notes.                              Differential diagnosis includes, but is not limited to, cardiac arrhythmia, orthostatic hypotension, dehydration, pneumonia, PE.  Patient's presentation is most consistent with acute presentation with potential threat to life or bodily  function.  Patient is an 89 year old female who presented today for a syncopal episode.  On arrival she was mildly tachycardic but no hypotension.  She did have a productive sounding cough but no fever or hypotension.  Initially without any tachycardia.  Respiratory rate was slightly elevated.  Initially concern for dehydration giving that she is G-tube fed and now asymptomatic after getting fluids on arrival.  Patient was then noted to have increased respiratory rate with oxygenation dropping.  She was 83% on room air with good waveform.  Follow-up chest x-ray and viral swab was ordered which were both negative.  Given ongoing hypoxia with tachycardia, CTA chest was ordered to evaluate lung parenchyma and  signs of PE.  No evidence of PE but does have a right-sided bronchopneumonia.  Patient was given ceftriaxone and azithromycin.  Given additional fluids to meet the 30 cc/kg bolus requirement.  Sepsis protocol was initiated as well at this time.  Admitted to hospitalist for further care.  The patient is on the cardiac monitor to evaluate for evidence of arrhythmia and/or significant heart rate changes. Clinical Course as of 12/07/22 2336  Sat Dec 07, 2022  1951 CBC with Differential Unremarkable [DW]  1954 Comprehensive metabolic panel(!) Hypokalemia present [DW]  2002 Brain natriuretic peptide(!) Mild BNP elevation but lower than most recent values.  Do not suspect volume overload at this time [DW]  2018 Urinalysis, Routine w reflex microscopic -Urine, Clean Catch(!) Unremarkable [DW]  2028 Went to reassess patient.  Was found to be 82% on room air with mild tachypnea in the low 20s.  Does have a productive sounding cough.  Will get chest x-ray and respiratory swab at this time [DW]  2055 DG Chest Portable 1 View No acute pathology [DW]  2254 CT Angio Chest PE W and/or Wo Contrast Pulmonary findings suggestive of bronchopneumonia most prominent within the right middle lobe. [DW]    Clinical  Course User Index [DW] Janith Lima, MD     FINAL CLINICAL IMPRESSION(S) / ED DIAGNOSES   Final diagnoses:  Acute hypoxic respiratory failure (HCC)  Sepsis, due to unspecified organism, unspecified whether acute organ dysfunction present [facility])  Bronchopneumonia     Rx / DC Orders   ED Discharge Orders     None        Note:  This document was prepared using Dragon voice recognition software and may include unintentional dictation errors.   Janith Lima, MD 12/07/22 [phone]

## 2022-12-08 DIAGNOSIS — J9601 Acute respiratory failure with hypoxia: Secondary | ICD-10-CM | POA: Diagnosis present

## 2022-12-08 DIAGNOSIS — E039 Hypothyroidism, unspecified: Secondary | ICD-10-CM | POA: Diagnosis present

## 2022-12-08 DIAGNOSIS — I10 Essential (primary) hypertension: Secondary | ICD-10-CM | POA: Diagnosis present

## 2022-12-08 DIAGNOSIS — J180 Bronchopneumonia, unspecified organism: Secondary | ICD-10-CM | POA: Diagnosis not present

## 2022-12-08 DIAGNOSIS — A419 Sepsis, unspecified organism: Secondary | ICD-10-CM | POA: Diagnosis present

## 2022-12-08 DIAGNOSIS — E876 Hypokalemia: Secondary | ICD-10-CM | POA: Diagnosis present

## 2022-12-08 HISTORY — DX: Acute respiratory failure with hypoxia: J96.01

## 2022-12-08 LAB — PROTIME-INR
INR: 1.1 (ref 0.8–1.2)
Prothrombin Time: 14.8 seconds (ref 11.4–15.2)

## 2022-12-08 LAB — BASIC METABOLIC PANEL
Anion gap: 7 (ref 5–15)
BUN: 22 mg/dL (ref 8–23)
CO2: 30 mmol/L (ref 22–32)
Calcium: 8.4 mg/dL — ABNORMAL LOW (ref 8.9–10.3)
Chloride: 99 mmol/L (ref 98–111)
Creatinine, Ser: 0.87 mg/dL (ref 0.44–1.00)
GFR, Estimated: >60 mL/min (ref >60)
Glucose, Bld: 148 mg/dL — ABNORMAL HIGH (ref 70–99)
Potassium: 3.9 mmol/L (ref 3.5–5.1)
Sodium: 136 mmol/L (ref 135–145)

## 2022-12-08 LAB — LACTIC ACID, PLASMA
Lactic Acid, Venous: 2.4 mmol/L (ref 0.5–1.9)
Lactic Acid, Venous: 2.6 mmol/L (ref 0.5–1.9)
Lactic Acid, Venous: 1.8 mmol/L (ref 0.5–1.9)

## 2022-12-08 LAB — CBC
HCT: 37.1 % (ref 36.0–46.0)
Hemoglobin: 11.9 g/dL — ABNORMAL LOW (ref 12.0–15.0)
MCH: 27.4 pg (ref 26.0–34.0)
MCHC: 32.1 g/dL (ref 30.0–36.0)
MCV: 85.5 fL (ref 80.0–100.0)
Platelets: 181 10*3/uL (ref 150–400)
RBC: 4.34 MIL/uL (ref 3.87–5.11)
RDW: 14.6 % (ref 11.5–15.5)
WBC: 14.1 10*3/uL — ABNORMAL HIGH (ref 4.0–10.5)
nRBC: 0 % (ref 0.0–0.2)

## 2022-12-08 LAB — PROCALCITONIN: Procalcitonin: 2.33 ng/mL

## 2022-12-08 MED ORDER — ENSURE ENLIVE PO LIQD: 237.0000 mL | Freq: Three times a day (TID) | ORAL | Status: DC

## 2022-12-08 MED ORDER — GUAIFENESIN ER 600 MG PO TB12
600.0000 mg | ORAL_TABLET | Freq: Two times a day (BID) | ORAL | Status: DC
  Administered 2022-12-08 – 2022-12-09 (×4): 600 mg via ORAL
  Filled 2022-12-08 (×5): qty 1

## 2022-12-08 MED ORDER — ENSURE ENLIVE PO LIQD
237.0000 mL | Freq: Three times a day (TID) | ORAL | Status: DC
  Administered 2022-12-08 (×2): 237 mL

## 2022-12-08 MED ORDER — IPRATROPIUM-ALBUTEROL 0.5-2.5 (3) MG/3ML IN SOLN
3.0000 mL | Freq: Four times a day (QID) | RESPIRATORY_TRACT | Status: DC
  Filled 2022-12-08: qty 3

## 2022-12-08 MED ORDER — HYDROCOD POLI-CHLORPHE POLI ER 10-8 MG/5ML PO SUER: 5.0000 mL | Freq: Two times a day (BID) | ORAL | Status: DC | PRN

## 2022-12-08 MED ORDER — IPRATROPIUM-ALBUTEROL 0.5-2.5 (3) MG/3ML IN SOLN
3.0000 mL | Freq: Three times a day (TID) | RESPIRATORY_TRACT | Status: DC
  Administered 2022-12-08 – 2022-12-10 (×6): 3 mL via RESPIRATORY_TRACT
  Filled 2022-12-08 (×5): qty 3

## 2022-12-08 MED ORDER — ENOXAPARIN SODIUM 40 MG/0.4ML IJ SOSY
40.0000 mg | PREFILLED_SYRINGE | INTRAMUSCULAR | Status: DC
  Administered 2022-12-08 – 2022-12-10 (×3): 40 mg via SUBCUTANEOUS
  Filled 2022-12-08 (×3): qty 0.4

## 2022-12-08 NOTE — Assessment & Plan Note (Addendum)
--  Continue Toprol-XL, amlodipine

## 2022-12-08 NOTE — Progress Notes (Signed)
PHARMACIST - PHYSICIAN COMMUNICATION  CONCERNING:  Enoxaparin (Lovenox) for DVT Prophylaxis   RECOMMENDATION: Patient's enoxaparin was dose adjusted to 30 mg every 24 hours for VTE prophylaxis.  Filed Weights   12/08/22 0233  Weight: 57.9 kg (127 lb 11.2 oz)   Body mass index is 21.92 kg/m.  Estimated Creatinine Clearance: 39.3 mL/min (by C-G formula based on SCr of 0.87 mg/dL).  DESCRIPTION: Pharmacy has adjusted enoxaparin dose per [facility] policy.  Patient is now receiving enoxaparin 40 mg every 24 hours   Littie Deeds, PharmD PGY1 Pharmacy Resident 12/08/2022 7:12 AM

## 2022-12-08 NOTE — ED Notes (Signed)
No vanilla ensure available in the department. Pt requested any milk supplement for g tube feeding. Admin 8oz of lactose free milk and flushed with water.

## 2022-12-08 NOTE — Assessment & Plan Note (Signed)
-   We will continue Synthroid. 

## 2022-12-08 NOTE — ED Notes (Signed)
Pt granddaughters going home with pt clothing and hearing aide belongings.

## 2022-12-08 NOTE — ED Notes (Signed)
Pt c/o nausea.

## 2022-12-08 NOTE — ED Notes (Signed)
Provided urine incontinence care. Replaced bed linen, bed pads, pt gown, and blankets. Pt repositioned, side rails up x2,  and call light placed back within reach. No further needs identified at this time.

## 2022-12-08 NOTE — Progress Notes (Signed)
PHARMACIST - PHYSICIAN COMMUNICATION  CONCERNING:  Enoxaparin (Lovenox) for DVT Prophylaxis    RECOMMENDATION: Patient was prescribed enoxaprin 40mg  q24 hours for VTE prophylaxis.   Filed Weights   12/08/22 0233  Weight: 57.9 kg (127 lb 11.2 oz)    Body mass index is 21.92 kg/m.  Estimated Creatinine Clearance: 29 mL/min (A) (by C-G formula based on SCr of 1.18 mg/dL (H)).  Patient is candidate for enoxaparin 30mg  every 24 hours based on CrCl <42ml/min or Weight <45kg  DESCRIPTION: Pharmacy has adjusted enoxaparin dose per [facility] policy.  Patient is now receiving enoxaparin 30 mg every 24 hours   Otelia Sergeant, PharmD, [facility] 12/08/2022 2:34 AM

## 2022-12-08 NOTE — Assessment & Plan Note (Addendum)
K replaced on admission. Monitor BMP, replace K PRN

## 2022-12-08 NOTE — ED Notes (Signed)
 Provided urine incontinence care. Replaced bed linen, bed pads, pt gown, and blankets. Pt repositioned, side rails up x2,  and call light placed back within reach. No further needs identified at this time.

## 2022-12-08 NOTE — ED Notes (Signed)
Advised nurse that patient has ready bed 

## 2022-12-08 NOTE — Assessment & Plan Note (Addendum)
Due to bronchopneumonia. spO2 on room air in the ED was 88%, improved to 96% on 4 L/min Anna Lara. 9/23 - stable on room air --Wean Lara as tolerated --Target spO2 > 92% --Mgmt of bronchopneumonia as outlined --Incentive spirometry

## 2022-12-08 NOTE — Plan of Care (Signed)
Problem: Nutrition: Goal: Adequate nutrition will be maintained Outcome: Progressing   Problem: Coping: Goal: Level of anxiety will decrease Outcome: Progressing   Problem: Pain Managment: Goal: General experience of comfort will improve Outcome: Progressing   Problem: Safety: Goal: Ability to remain free from injury will improve Outcome: Progressing   Problem: Skin Integrity: Goal: Risk for impaired skin integrity will decrease Outcome: Progressing

## 2022-12-08 NOTE — Assessment & Plan Note (Addendum)
--  Treated IV Rocephin and Zithromax. --Discharge on 3 more days PO Zithromax and Omnicef PER TUBE --Continue Mucinex BID, Tussionex PRN, Duonebs TID --Follow blood cultures - neg to date

## 2022-12-08 NOTE — Progress Notes (Signed)
Progress Note   Patient: Anna Lara FAO:[id number] DOB: 10-Jun-1933 DOA: 12/07/2022     1 DOS: the patient was seen and examined on 12/08/2022   Brief hospital course: HPI on admission by Dr. Arville Care:  "Anna Lara is a 89 y.o. African-American female with medical history significant for hypertension, stage III chronic kidney disease, diastolic heart failure, hypothyroidism, dysphagia status post G-tube placement and vertigo, who presented to the emergency room with acute onset of near syncope when she was trying to get up and get lightheaded and dizzy then had a brief near syncope with lightheadedness that lasted less than 2 minutes.  She denied losing consciousness.  No chest pain or palpitations.  She has been having cough productive of yellowish sputum over the last 4 weeks.  Prior to that she had COVID-19 infection... "  Further HPI on admission and detailed ED course as per H&P.   Evaluation in the ED including CTA chest revealed bronchopneumonia most prominent in the right lower lobe (it was negative for PE).  Pt initially had O2 desatruation to 88% on room air, and required supplemental oxygen at 4 L/min [place].  Patient was admitted to the hospital and started on IV antibiotics for bronchopneumonia. Further hospital course and management as outlined below.    Assessment and Plan: * Bronchopneumonia --Continue IV Rocephin and Zithromax. --Continue Mucinex BID, Tussionex PRN, Duonebs TID --Collect sputum culture if able --Follow blood cultures   Sepsis due to pneumonia (HCC) Severe sepsis Severe Sepsis POA with tachypnea, tachycardia in setting of symptoms & imaging consistent with pneumonia. Lactic acidosis indicates organ dysfunction and severe sepsis. --Follow cultures --IV abx and other mgmt as outlined  Acute respiratory failure with hypoxia (HCC) Due to bronchopneumonia. spO2 on room air in the ED was 88%, improved to 96% on 4 L/min [place] O2. --Wean O2 as  tolerated --Target spO2 > 92% --Mgmt of bronchopneumonia as outlined --Incentive spirometry  Hypokalemia K replaced on admission. Monitor BMP, replace K PRN  Hypothyroidism - We will continue Synthroid.  Essential hypertension --Continue Toprol-XL, amlodipine        Subjective: Pt seen with family members at beside today.  They confirm pt uses 4 Ensures per G tube at home, is not on other tube feed formula at this time, but they would appreciate dietitian's recommendations to optimize her nutrition.  Pt reports feeling better since admission.  Currently off oxygen.  She denies acute complaints.   Physical Exam: Vitals:   12/08/22 0730 12/08/22 0905 12/08/22 1005 12/08/22 1607  BP: 119/63 125/71  125/64  Pulse: 87 91  84  Resp: 20 16  18   Temp:  98.3 F (36.8 C)  97.8 F (36.6 C)  TempSrc:      SpO2: 97% 97% 93% 93%  Weight:      Height:       General exam: awake, alert, no acute distress, frail and chronically ill appearing HEENT: moist mucus membranes, hearing grossly normal  Respiratory system: CTAB but diminished due to poor inspiratory volumes, no wheezes or rhonchi, normal respiratory effort on room air. Cardiovascular system: normal S1/S2, RRR, +systolic murmur, no pedal edema.   Gastrointestinal system: soft, NT, ND, G-tube present Central nervous system: A&O x3. no gross focal neurologic deficits, normal speech Extremities: moves all , no edema, normal tone Skin: dry, intact, normal temperature, normal color, No rashes, lesions or ulcers Psychiatry: normal mood, congruent affect, judgement and insight appear normal   Data Reviewed:  Notable labs -- WBC  14.1, Hbg 11.9, procal 2.33, lactic acid 2.4 >> 2.6, glucose 148, Ca 8.4  Family Communication: at bedside on rounds, updated  Disposition: Status is: Inpatient Remains inpatient appropriate because: severity of illness, remains on IV antibiotics pending further improvement   Planned Discharge  Destination: Home    Time spent: 45 minutes  Author: Pennie Banter, DO 12/08/2022 5:18 PM  For on call review [email or web address].

## 2022-12-08 NOTE — Assessment & Plan Note (Addendum)
Severe sepsis Severe Sepsis POA with tachypnea, tachycardia in setting of symptoms & imaging consistent with pneumonia. Lactic acidosis indicates organ dysfunction and severe sepsis. Lactic acid normalized. --Follow cultures --IV abx and other mgmt as outlined

## 2022-12-08 NOTE — ED Notes (Signed)
Pt granddaughter stating pt takes 1 bottle of vanilla ensure at nighttime via peg tube.

## 2022-12-09 DIAGNOSIS — J180 Bronchopneumonia, unspecified organism: Secondary | ICD-10-CM | POA: Diagnosis not present

## 2022-12-09 LAB — PHOSPHORUS: Phosphorus: 2.3 mg/dL — ABNORMAL LOW (ref 2.5–4.6)

## 2022-12-09 LAB — CBC
HCT: 33.2 % — ABNORMAL LOW (ref 36.0–46.0)
Hemoglobin: 10.4 g/dL — ABNORMAL LOW (ref 12.0–15.0)
MCH: 26.9 pg (ref 26.0–34.0)
MCHC: 31.3 g/dL (ref 30.0–36.0)
MCV: 86 fL (ref 80.0–100.0)
Platelets: 133 10*3/uL — ABNORMAL LOW (ref 150–400)
RBC: 3.86 MIL/uL — ABNORMAL LOW (ref 3.87–5.11)
RDW: 15 % (ref 11.5–15.5)
WBC: 14.7 10*3/uL — ABNORMAL HIGH (ref 4.0–10.5)
nRBC: 0 % (ref 0.0–0.2)

## 2022-12-09 LAB — BASIC METABOLIC PANEL
Anion gap: 7 (ref 5–15)
BUN: 24 mg/dL — ABNORMAL HIGH (ref 8–23)
CO2: 27 mmol/L (ref 22–32)
Calcium: 8.4 mg/dL — ABNORMAL LOW (ref 8.9–10.3)
Chloride: 102 mmol/L (ref 98–111)
Creatinine, Ser: 1.05 mg/dL — ABNORMAL HIGH (ref 0.44–1.00)
GFR, Estimated: 51 mL/min — ABNORMAL LOW (ref >60)
Glucose, Bld: 108 mg/dL — ABNORMAL HIGH (ref 70–99)
Potassium: 3.9 mmol/L (ref 3.5–5.1)
Sodium: 136 mmol/L (ref 135–145)

## 2022-12-09 LAB — MAGNESIUM: Magnesium: 2.1 mg/dL (ref 1.7–2.4)

## 2022-12-09 MED ORDER — FREE WATER
200.0000 mL | Freq: Three times a day (TID) | Status: DC
  Administered 2022-12-09 – 2022-12-10 (×4): 200 mL

## 2022-12-09 MED ORDER — POTASSIUM & SODIUM PHOSPHATES 280-160-250 MG PO PACK
1.0000 | PACK | ORAL | Status: AC
  Administered 2022-12-09 (×3): 1
  Filled 2022-12-09 (×3): qty 1

## 2022-12-09 MED ORDER — ENSURE ENLIVE PO LIQD: 237.0000 mL | Freq: Four times a day (QID) | ORAL | Status: DC

## 2022-12-09 MED ORDER — ASPIRIN 81 MG PO CHEW
81.0000 mg | CHEWABLE_TABLET | Freq: Every evening | ORAL | Status: DC
  Administered 2022-12-09: 81 mg via ORAL
  Filled 2022-12-09: qty 1

## 2022-12-09 MED ORDER — ENSURE ENLIVE PO LIQD
237.0000 mL | Freq: Three times a day (TID) | ORAL | Status: DC
  Administered 2022-12-09 – 2022-12-10 (×4): 237 mL

## 2022-12-09 MED ORDER — FREE WATER: 200.0000 mL | Freq: Four times a day (QID) | Status: DC

## 2022-12-09 MED ORDER — AZITHROMYCIN 500 MG PO TABS
500.0000 mg | ORAL_TABLET | Freq: Every day | ORAL | Status: DC
  Filled 2022-12-09: qty 1

## 2022-12-09 NOTE — Assessment & Plan Note (Signed)
Pt on G tube feeds due to dysphagia. Malnutrition due to chronic illness - dysphagia 2/2 remote hx of head/neck cancer),  as evidenced by mild fat depletion, moderate fat depletion, mild muscle depletion, moderate muscle depletion  --Appreciate Dietitian recommendations" "Resume home TF regimen via PEG per pt/ family request: 237 ml Ensure Enlive TID 100 ml free water flush before and after each feeding administration"

## 2022-12-09 NOTE — Plan of Care (Signed)
  Problem: Education: Goal: Knowledge of risk factors and measures for prevention of condition will improve Outcome: Progressing   Problem: Coping: Goal: Psychosocial and spiritual needs will be supported Outcome: Progressing   Problem: Respiratory: Goal: Will maintain a patent airway Outcome: Progressing   Problem: Fluid Volume: Goal: Hemodynamic stability will improve Outcome: Progressing   Problem: Health Behavior/Discharge Planning: Goal: Ability to manage health-related needs will improve Outcome: Progressing

## 2022-12-09 NOTE — Progress Notes (Signed)
PHARMACIST - PHYSICIAN COMMUNICATION DR:   Denton Lank CONCERNING: Antibiotic IV to Oral Route Change Policy  RECOMMENDATION: This patient is receiving azithromycin by the intravenous route.  Based on criteria approved by the Pharmacy and Therapeutics Committee, the antibiotic is being converted to the equivalent oral dose form.   DESCRIPTION: These criteria include: Patient being treated for a respiratory tract infection, urinary tract infection, cellulitis or clostridium difficile associated diarrhea if on metronidazole The patient is not neutropenic and does not exhibit a GI malabsorption state The patient is eating (either orally or via tube) and/or has been taking other orally administered medications for a least 24 hours The patient is improving clinically and has a Tmax < 100.5    Elliot Gurney, PharmD, BCPS Clinical Pharmacist  12/09/2022 2:35 PM

## 2022-12-09 NOTE — Plan of Care (Signed)
  Problem: Education: Goal: Knowledge of risk factors and measures for prevention of condition will improve Outcome: Progressing   Problem: Clinical Measurements: Goal: Will remain free from infection Outcome: Progressing   Problem: Pain Managment: Goal: General experience of comfort will improve Outcome: Progressing

## 2022-12-09 NOTE — Progress Notes (Signed)
Initial Nutrition Assessment  DOCUMENTATION CODES:   Non-severe (moderate) malnutrition in context of chronic illness  INTERVENTION:   -Resume home TF regimen via PEG per pt/ family request:   237 ml Ensure Enlive TID   100 ml free water flush before and after each feeding administration   Tube feeding regimen provides 1050 kcal (72% of needs), 60 grams of protein, and 484 ml of H2O. Total free water: 1084 ml daily  NUTRITION DIAGNOSIS:   Moderate Malnutrition related to chronic illness (chronic dysphagia 2/2 remote head and neck cancer) as evidenced by mild fat depletion, moderate fat depletion, mild muscle depletion, moderate muscle depletion.  GOAL:   Patient will meet greater than or equal to 90% of their needs  MONITOR:   TF tolerance  REASON FOR ASSESSMENT:   Consult Assessment of nutrition requirement/status, Enteral/tube feeding initiation and management  ASSESSMENT:   Pt with medical history significant for hypertension, stage III chronic kidney disease, diastolic heart failure, hypothyroidism, dysphagia status post G-tube placement and vertigo, who presented with acute onset of near syncope when she was trying to get up and get lightheaded and dizzy then had a brief near syncope with lightheadedness that lasted less than 2 minutes.  Pt admitted with bronchopneumonia and sepsis secondary to pnuemonia.   Reviewed I/O's: +350 ml x 24 hours and +2.6 L since admission  UOP: 150 ml x 24 hours   Case discussed with RN. Pt tolerating TF without difficulty.   Pt sitting up in bed, pleasant and in good spirits today. NO family at bedside. Pt reports feeling better today. Pt familiar to this RD due to prior admission and pt remembers RD from that admission. Pt confirms that she does not take anything by mouth and uses PEG tube for sole source nutrition. Pt has been using Ensure as TF formula for the past 25 years since PEG was placed. Pt receives TF formula and supplies  through [facility] and pt's PCP manages prescriptions for TF. Pt denies ever using a standard TF formula in the past and has only tried Ensure.   Pt denies any difficulty with further TF regimen. She denies any concern about TF regimen or need to change at this time. Noted at list visit, discussed possibility of modifying TF regimen or trying a standard TF formula, however, has denied need to change regimen.   Reviewed wt hx; no wt loss noted. Pt denies any weight loss.   Medications reviewed and include potassium and sodium phosphates.   Lab Results  Component Value Date   HGBA1C 5.9 (H) 10/23/2022   PTA DM medications are none.   Labs reviewed: CBGS: 135 (inpatient orders for glycemic control are none).    Diet Order:   Diet Order             Diet NPO time specified  Diet effective now                  EDUCATION NEEDS:   Education needs have been addressed  Skin:  Skin Assessment: Reviewed RN Assessment  Last BM:  12/09/22 (type 5)  Height:   Ht Readings from Last 1 Encounters:  12/08/22 5\' 4"  (1.626 m)    Weight:   Wt Readings from Last 1 Encounters:  12/08/22 57.9 kg    Ideal Body Weight:  54.5 kg  BMI:  Body mass index is 21.92 kg/m.  Estimated Nutritional Needs:   Kcal:  1500-1700  Protein:  75-90 grams  Fluid:  >  1.5 L    Levada Schilling, RD, LDN, CDCES Registered Dietitian II Certified Diabetes Care and Education Specialist Please refer to [facility] for RD and/or RD on-call/weekend/after hours pager

## 2022-12-09 NOTE — Progress Notes (Signed)
Progress Note   Patient: Anna Lara ZOX:[id number] DOB: 1933/11/08 DOA: 12/07/2022     2 DOS: the patient was seen and examined on 12/09/2022   Brief hospital course: HPI on admission by Dr. Arville Care:  "KHALIA PIERINI is a 89 y.o. African-American female with medical history significant for hypertension, stage III chronic kidney disease, diastolic heart failure, hypothyroidism, dysphagia status post G-tube placement and vertigo, who presented to the emergency room with acute onset of near syncope when she was trying to get up and get lightheaded and dizzy then had a brief near syncope with lightheadedness that lasted less than 2 minutes.  She denied losing consciousness.  No chest pain or palpitations.  She has been having cough productive of yellowish sputum over the last 4 weeks.  Prior to that she had COVID-19 infection... "  Further HPI on admission and detailed ED course as per H&P.   Evaluation in the ED including CTA chest revealed bronchopneumonia most prominent in the right lower lobe (it was negative for PE).  Pt initially had O2 desatruation to 88% on room air, and required supplemental oxygen at 4 L/min [place].  Patient was admitted to the hospital and started on IV antibiotics for bronchopneumonia. Further hospital course and management as outlined below.    Assessment and Plan: * Bronchopneumonia --Continue IV Rocephin and Zithromax. --Continue Mucinex BID, Tussionex PRN, Duonebs TID --Collect sputum culture if able --Follow blood cultures   Sepsis due to pneumonia (HCC) Severe sepsis Severe Sepsis POA with tachypnea, tachycardia in setting of symptoms & imaging consistent with pneumonia. Lactic acidosis indicates organ dysfunction and severe sepsis. Lactic acid normalized. --Follow cultures --IV abx and other mgmt as outlined  Acute respiratory failure with hypoxia (HCC) Due to bronchopneumonia. spO2 on room air in the ED was 88%, improved to 96% on 4 L/min [place]  O2. 9/23 - stable on room air --Wean O2 as tolerated --Target spO2 > 92% --Mgmt of bronchopneumonia as outlined --Incentive spirometry  Hypokalemia K replaced on admission. Monitor BMP, replace K PRN  Hypothyroidism -- Synthroid  Essential hypertension --Continue Toprol-XL, amlodipine  Malnutrition of moderate degree Pt on G tube feeds due to dysphagia. Malnutrition due to chronic illness - dysphagia 2/2 remote hx of head/neck cancer),  as evidenced by mild fat depletion, moderate fat depletion, mild muscle depletion, moderate muscle depletion  --Appreciate Dietitian recommendations" "Resume home TF regimen via PEG per pt/ family request: 237 ml Ensure Enlive TID 100 ml free water flush before and after each feeding administration"        Subjective: Pt awake resting in bed when seen this AM. She reports feeling okay. Denies SOB or F/C.  Has intermittent cough.  She has copious oral secretions and/or phlegm that she spits into tissues.  Denies any acute complaints. Overall says she feels better.    Physical Exam: Vitals:   12/08/22 2047 12/09/22 0031 12/09/22 0852 12/09/22 1502  BP:  112/62 124/70 120/66  Pulse:  82 90 92  Resp:  17 16 18   Temp:  97.8 F (36.6 C) 98.2 F (36.8 C) 98.2 F (36.8 C)  TempSrc:    Oral  SpO2: 95% 97% 97% 96%  Weight:      Height:       General exam: awake, alert, no acute distress, frail and chronically ill appearing HEENT: moist mucus membranes, hearing grossly normal  Respiratory system: CTAB diminished bases, no wheezes, rales or rhonchi, normal respiratory effort. On room air Cardiovascular system: normal S1/S2,  RRR, +systolic murmur no pedal edema.   Gastrointestinal system: soft, NT, ND, G tube present Central nervous system: A&O x 2+. no gross focal neurologic deficits, normal speech Extremities: moves all, no edema, normal tone Skin: dry, intact, normal temperature Psychiatry: normal mood, congruent affect, judgement and  insight appear normal    Data Reviewed:  Notable labs -- WBC 14.1 >> 14.7, Hbg 11.9 >> 10.4, lactic acid normalized 1.8, glucose 108, BUN 24, Cr 10.5 up from 0.87, Ca 8.4 , phos 2.3  Family Communication: at bedside on rounds 9/22, updated. None present today, will attempt to call.  Disposition: Status is: Inpatient Remains inpatient appropriate because: severity of illness, remains on IV antibiotics pending further improvement   Planned Discharge Destination: Home    Time spent: 45 minutes  Author: Pennie Banter, DO 12/09/2022 5:49 PM  For on call review [email or web address].

## 2022-12-10 ENCOUNTER — Encounter: Payer: Self-pay | Admitting: Family Medicine

## 2022-12-10 DIAGNOSIS — J180 Bronchopneumonia, unspecified organism: Secondary | ICD-10-CM | POA: Diagnosis not present

## 2022-12-10 DIAGNOSIS — R1310 Dysphagia, unspecified: Secondary | ICD-10-CM

## 2022-12-10 LAB — BASIC METABOLIC PANEL
Anion gap: 10 (ref 5–15)
BUN: 22 mg/dL (ref 8–23)
CO2: 25 mmol/L (ref 22–32)
Calcium: 8.1 mg/dL — ABNORMAL LOW (ref 8.9–10.3)
Chloride: 100 mmol/L (ref 98–111)
Creatinine, Ser: 1.05 mg/dL — ABNORMAL HIGH (ref 0.44–1.00)
GFR, Estimated: 51 mL/min — ABNORMAL LOW (ref >60)
Glucose, Bld: 112 mg/dL — ABNORMAL HIGH (ref 70–99)
Potassium: 3.7 mmol/L (ref 3.5–5.1)
Sodium: 135 mmol/L (ref 135–145)

## 2022-12-10 LAB — CBC
HCT: 33 % — ABNORMAL LOW (ref 36.0–46.0)
Hemoglobin: 10.5 g/dL — ABNORMAL LOW (ref 12.0–15.0)
MCH: 27.2 pg (ref 26.0–34.0)
MCHC: 31.8 g/dL (ref 30.0–36.0)
MCV: 85.5 fL (ref 80.0–100.0)
Platelets: 135 10*3/uL — ABNORMAL LOW (ref 150–400)
RBC: 3.86 MIL/uL — ABNORMAL LOW (ref 3.87–5.11)
RDW: 14.7 % (ref 11.5–15.5)
WBC: 10.5 10*3/uL (ref 4.0–10.5)
nRBC: 0 % (ref 0.0–0.2)

## 2022-12-10 LAB — PROCALCITONIN: Procalcitonin: 4.3 ng/mL

## 2022-12-10 LAB — PHOSPHORUS: Phosphorus: 3.2 mg/dL (ref 2.5–4.6)

## 2022-12-10 MED ORDER — LEVOTHYROXINE SODIUM 50 MCG PO TABS: 125.0000 ug | ORAL_TABLET | Freq: Every day | ORAL | Status: DC

## 2022-12-10 MED ORDER — GUAIFENESIN 100 MG/5ML PO LIQD
5.0000 mL | Freq: Three times a day (TID) | ORAL | Status: DC
  Administered 2022-12-10: 5 mL
  Filled 2022-12-10 (×2): qty 10

## 2022-12-10 MED ORDER — AMLODIPINE BESYLATE 10 MG PO TABS
10.0000 mg | ORAL_TABLET | Freq: Every day | ORAL | Status: DC
  Administered 2022-12-10: 10 mg

## 2022-12-10 MED ORDER — AZITHROMYCIN 500 MG PO TABS: 500.0000 mg | ORAL_TABLET | Freq: Every day | ORAL | 0 refills | Status: AC

## 2022-12-10 MED ORDER — TRAZODONE HCL 50 MG PO TABS: 25.0000 mg | ORAL_TABLET | Freq: Every evening | ORAL | Status: DC | PRN

## 2022-12-10 MED ORDER — ONDANSETRON HCL 4 MG/2ML IJ SOLN: 4.0000 mg | Freq: Four times a day (QID) | INTRAMUSCULAR | Status: DC | PRN

## 2022-12-10 MED ORDER — CEFDINIR 300 MG PO CAPS: 300.0000 mg | ORAL_CAPSULE | Freq: Two times a day (BID) | ORAL | 0 refills | Status: AC

## 2022-12-10 MED ORDER — METOPROLOL TARTRATE 25 MG PO TABS
12.5000 mg | ORAL_TABLET | Freq: Two times a day (BID) | ORAL | Status: DC
  Administered 2022-12-10: 12.5 mg
  Filled 2022-12-10: qty 1

## 2022-12-10 MED ORDER — FUROSEMIDE 20 MG PO TABS: 20.0000 mg | ORAL_TABLET | Freq: Every day | ORAL | 0 refills | Status: AC | PRN

## 2022-12-10 MED ORDER — AZITHROMYCIN 500 MG PO TABS
500.0000 mg | ORAL_TABLET | Freq: Every day | ORAL | Status: DC
  Administered 2022-12-10: 500 mg

## 2022-12-10 MED ORDER — MAGNESIUM HYDROXIDE 400 MG/5ML PO SUSP: 30.0000 mL | Freq: Every day | ORAL | Status: DC | PRN

## 2022-12-10 MED ORDER — IPRATROPIUM-ALBUTEROL 0.5-2.5 (3) MG/3ML IN SOLN: 3.0000 mL | RESPIRATORY_TRACT | Status: DC | PRN

## 2022-12-10 MED ORDER — ACETAMINOPHEN 650 MG RE SUPP: 650.0000 mg | Freq: Four times a day (QID) | RECTAL | Status: DC | PRN

## 2022-12-10 MED ORDER — ENSURE ENLIVE PO LIQD: 237.0000 mL | Freq: Three times a day (TID) | ORAL | Status: AC

## 2022-12-10 MED ORDER — FREE WATER: 200.0000 mL | Freq: Three times a day (TID) | Status: AC

## 2022-12-10 MED ORDER — ASPIRIN 81 MG PO CHEW: 81.0000 mg | CHEWABLE_TABLET | Freq: Every evening | ORAL | Status: DC

## 2022-12-10 MED ORDER — HYDROCOD POLI-CHLORPHE POLI ER 10-8 MG/5ML PO SUER: 5.0000 mL | Freq: Two times a day (BID) | ORAL | Status: DC | PRN

## 2022-12-10 MED ORDER — GUAIFENESIN 100 MG/5ML PO LIQD: 5.0000 mL | Freq: Three times a day (TID) | ORAL | Status: AC

## 2022-12-10 MED ORDER — ACETAMINOPHEN 325 MG PO TABS: 650.0000 mg | ORAL_TABLET | Freq: Four times a day (QID) | ORAL | Status: DC | PRN

## 2022-12-10 MED ORDER — ONDANSETRON HCL 4 MG PO TABS: 4.0000 mg | ORAL_TABLET | Freq: Four times a day (QID) | ORAL | Status: DC | PRN

## 2022-12-10 NOTE — Discharge Summary (Signed)
Physician Discharge Summary   Patient: Anna Lara MRN: [id number] DOB: 1933-07-15  Admit date:     12/07/2022  Discharge date: 12/10/2022  Discharge Physician: Pennie Banter   PCP: Leanna Sato, MD   Recommendations at discharge:    Follow up with Primary Care Repeat labs at follow up  Discharge Diagnoses: Principal Problem:   Bronchopneumonia Active Problems:   Sepsis due to pneumonia (HCC)   Hypokalemia   Malnutrition of moderate degree   Essential hypertension   Hypothyroidism   Dysphagia  Resolved Problems:   Acute respiratory failure with hypoxia [facility])  Hospital Course: HPI on admission by Dr. Arville Care:  "Kathee Polite is a 89 y.o. African-American female with medical history significant for hypertension, stage III chronic kidney disease, diastolic heart failure, hypothyroidism, dysphagia status post G-tube placement and vertigo, who presented to the emergency room with acute onset of near syncope when she was trying to get up and get lightheaded and dizzy then had a brief near syncope with lightheadedness that lasted less than 2 minutes.  She denied losing consciousness.  No chest pain or palpitations.  She has been having cough productive of yellowish sputum over the last 4 weeks.  Prior to that she had COVID-19 infection... "  Further HPI on admission and detailed ED course as per H&P.    Evaluation in the ED including CTA chest revealed bronchopneumonia most prominent in the right lower lobe (it was negative for PE).  Pt initially had O2 desatruation to 88% on room air, and required supplemental oxygen at 4 L/min [place].   Patient was admitted to the hospital and started on IV antibiotics for bronchopneumonia. Further hospital course and management as outlined below.    9/24 -- pt up in recliner this AM.  Says she feels well.  Denies any symptoms including cough, fever, chills or shortness of breath.  She is clinically improved, medically stable and requesting  d/c home today.    Granddaughter was updated by phone and in agreement, stated she would update patient's daughter.  Assessment and Plan: * Bronchopneumonia --Treated IV Rocephin and Zithromax. --Discharge on 3 more days PO Zithromax and Omnicef PER TUBE --Continue Mucinex BID, Tussionex PRN, Duonebs TID --Follow blood cultures - neg to date   Sepsis due to pneumonia (HCC) Severe sepsis Severe Sepsis POA with tachypnea, tachycardia in setting of symptoms & imaging consistent with pneumonia. Lactic acidosis indicates organ dysfunction and severe sepsis. Lactic acid normalized. --Follow cultures --IV abx and other mgmt as outlined  Acute respiratory failure with hypoxia (HCC)-resolved as of 12/10/2022 Due to bronchopneumonia. spO2 on room air in the ED was 88%, improved to 96% on 4 L/min [place] O2. 9/23 - stable on room air --Wean O2 as tolerated --Target spO2 > 92% --Mgmt of bronchopneumonia as outlined --Incentive spirometry  Hypokalemia K replaced on admission. Monitor BMP, replace K PRN  Dysphagia Due to remote head and neck cancer. Pt is NPO, exclusively uses G-tube for nutrition and medications. --Continue home TF's with Ensures TID, free water flushes 100 cc before and after each feed  Hypothyroidism -- Synthroid  Essential hypertension --Continue Toprol-XL, amlodipine  Malnutrition of moderate degree Pt on G tube feeds due to dysphagia. Malnutrition due to chronic illness - dysphagia 2/2 remote hx of head/neck cancer),  as evidenced by mild fat depletion, moderate fat depletion, mild muscle depletion, moderate muscle depletion  --Appreciate Dietitian recommendations" "Resume home TF regimen via PEG per pt/ family request: 237 ml Ensure Enlive  TID 100 ml free water flush before and after each feeding administration"         Consultants: None Procedures performed: None  Disposition: Home Diet recommendation:  Discharge Diet Orders (From admission, onward)      Start     Ordered   12/10/22 0000  Diet - tube feeds - Ensure TID    12/10/22 1048            DISCHARGE MEDICATION: Allergies as of 12/10/2022   No Known Allergies      Medication List     TAKE these medications    amLODipine 10 MG tablet Commonly known as: NORVASC Take 10 mg by mouth daily.   aspirin EC 81 MG tablet Take 81 mg by mouth every evening.   azithromycin 500 MG tablet Commonly known as: ZITHROMAX Place 1 tablet (500 mg total) into feeding tube daily for 3 days. Start taking on: December 11, 2022   cefdinir 300 MG capsule Commonly known as: OMNICEF Place 1 capsule (300 mg total) into feeding tube 2 (two) times daily.   feeding supplement Liqd Place 237 mLs into feeding tube 3 (three) times daily.   free water Soln Place 200 mLs into feeding tube 3 (three) times daily with meals. 100 mLs before Ensure, followed by 100 mLs after Ensure   furosemide 20 MG tablet Commonly known as: Lasix Take 1 tablet (20 mg total) by mouth daily as needed (swelling or weight gain as instructed). What changed:  when to take this reasons to take this   guaiFENesin 100 MG/5ML liquid Commonly known as: ROBITUSSIN Place 5 mLs into feeding tube 3 (three) times daily.   levothyroxine 125 MCG tablet Commonly known as: SYNTHROID Take 125 mcg by mouth every morning.   metoprolol succinate 25 MG 24 hr tablet Commonly known as: TOPROL-XL Take 25 mg by mouth every morning.         Discharge Exam: Filed Weights   12/08/22 0233  Weight: 57.9 kg   General exam: awake, alert, no acute distress, frail HEENT: moist mucus membranes, hearing grossly normal  Respiratory system: CTAB, no wheezes, rales or rhonchi, normal respiratory effort. Cardiovascular system: normal S1/S2, RRR,  no pedal edema.   Gastrointestinal system: soft, NT, ND, G tube present Central nervous system: A&O x 3. no gross focal neurologic deficits, normal speech Extremities: moves all, no  edema, normal tone Skin: dry, intact, normal temperature, normal color, No rashes, lesions or ulcers Psychiatry: normal mood, congruent affect, judgement and insight appear normal   Condition at discharge: stable  The results of significant diagnostics from this hospitalization (including imaging, microbiology, ancillary and laboratory) are listed below for reference.   Imaging Studies: CT Angio Chest PE W and/or Wo Contrast  Result Date: 12/07/2022 CLINICAL DATA:  acute hypoxia, tachycardia, negative CXR and viral swabs, concern for PE. syncopal episode today EXAM: CT ANGIOGRAPHY CHEST WITH CONTRAST TECHNIQUE: Multidetector CT imaging of the chest was performed using the standard protocol during bolus administration of intravenous contrast. Multiplanar CT image reconstructions and MIPs were obtained to evaluate the vascular anatomy. RADIATION DOSE REDUCTION: This exam was performed according to the departmental dose-optimization program which includes automated exposure control, adjustment of the mA and/or kV according to patient size and/or use of iterative reconstruction technique. CONTRAST:  75mL OMNIPAQUE IOHEXOL 350 MG/ML SOLN COMPARISON:  CT angiography chest 10/23/2022 FINDINGS: Cardiovascular: Satisfactory opacification of the pulmonary arteries to the segmental level. No evidence of pulmonary embolism. Enlarged heart size. No significant pericardial  effusion. The ascending thoracic aorta measures up to 4 cm-stable. Descending thoracic aorta is normal in caliber. At least moderate atherosclerotic plaque of the thoracic aorta. No coronary artery calcifications. Aortic valve leaflet calcification. Mediastinum/Nodes: No enlarged mediastinal, hilar, or axillary lymph nodes. Thyroid gland, trachea, and esophagus demonstrate no significant findings. Lungs/Pleura: Biapical pleural/pulmonary scarring. Interval development of diffuse mild bronchial wall thickening and peribronchovascular ground-glass  airspace opacities. Findings most prominent within the right middle lobe. No pulmonary nodule. No pulmonary mass. No pleural effusion. No pneumothorax. Upper Abdomen: Gastrostomy tube with tip terminating within the gastric lumen. Inflated balloon not visualized. Musculoskeletal: No chest wall abnormality. No suspicious lytic or blastic osseous lesions. No acute displaced fracture. Multilevel degenerative changes of the spine. Review of the MIP images confirms the above findings. IMPRESSION: 1. No pulmonary embolus. 2. Pulmonary findings suggestive of bronchopneumonia most prominent within the right middle lobe. 3. Stable aneurysmal ascending thoracic aorta (4 cm). Recommend annual imaging followup by CTA or MRA. This recommendation follows 2010 ACCF/AHA/AATS/ACR/ASA/SCA/SCAI/SIR/STS/SVM Guidelines for the Diagnosis and Management of Patients with Thoracic Aortic Disease. Circulation. 2010; 121: Z610-R604. Aortic aneurysm NOS (ICD10-I71.9). 4. Aortic Atherosclerosis (ICD10-I70.0) including aortic valve leaflet calcifications-correlate for aortic stenosis. 5. Cardiomegaly. Electronically Signed   By: Tish Frederickson M.D.   On: 12/07/2022 22:51   DG Chest Portable 1 View  Result Date: 12/07/2022 CLINICAL DATA:  Shortness of breath, hypoxia EXAM: PORTABLE CHEST - 1 VIEW COMPARISON:  10/23/2022 FINDINGS: Lungs are clear. Heart size and mediastinal contours are within normal limits. Aortic Atherosclerosis (ICD10-170.0). No effusion. Visualized bones unremarkable. IMPRESSION: No acute cardiopulmonary disease. Electronically Signed   By: Corlis Leak M.D.   On: 12/07/2022 20:51    Microbiology: Results for orders placed or performed during the hospital encounter of 12/07/22  Resp panel by RT-PCR (RSV, Flu A&B, Covid) Anterior Nasal Swab     Status: None   Collection Time: 12/07/22  8:44 PM   Specimen: Anterior Nasal Swab  Result Value Ref Range Status   SARS Coronavirus 2 by RT PCR NEGATIVE NEGATIVE Final     Comment: (NOTE) SARS-CoV-2 target nucleic acids are NOT DETECTED.  The SARS-CoV-2 RNA is generally detectable in upper respiratory specimens during the acute phase of infection. The lowest concentration of SARS-CoV-2 viral copies this assay can detect is 138 copies/mL. A negative result does not preclude SARS-Cov-2 infection and should not be used as the sole basis for treatment or other patient management decisions. A negative result may occur with  improper specimen collection/handling, submission of specimen other than nasopharyngeal swab, presence of viral mutation(s) within the areas targeted by this assay, and inadequate number of viral copies(<138 copies/mL). A negative result must be combined with clinical observations, patient history, and epidemiological information. The expected result is Negative.  Fact Sheet for Patients:  BloggerCourse.com  Fact Sheet for Healthcare Providers:  SeriousBroker.it  This test is no t yet approved or cleared by the [place] FDA and  has been authorized for detection and/or diagnosis of SARS-CoV-2 by FDA under an Emergency Use Authorization (EUA). This EUA will remain  in effect (meaning this test can be used) for the duration of the COVID-19 declaration under Section 564(b)(1) of the Act, 21 U.S.C.section 360bbb-3(b)(1), unless the authorization is terminated  or revoked sooner.       Influenza A by PCR NEGATIVE NEGATIVE Final   Influenza B by PCR NEGATIVE NEGATIVE Final    Comment: (NOTE) The Xpert Xpress SARS-CoV-2/FLU/RSV plus assay is intended as an  aid in the diagnosis of influenza from Nasopharyngeal swab specimens and should not be used as a sole basis for treatment. Nasal washings and aspirates are unacceptable for Xpert Xpress SARS-CoV-2/FLU/RSV testing.  Fact Sheet for Patients: BloggerCourse.com  Fact Sheet for Healthcare  Providers: SeriousBroker.it  This test is not yet approved or cleared by the [place] FDA and has been authorized for detection and/or diagnosis of SARS-CoV-2 by FDA under an Emergency Use Authorization (EUA). This EUA will remain in effect (meaning this test can be used) for the duration of the COVID-19 declaration under Section 564(b)(1) of the Act, 21 U.S.C. section 360bbb-3(b)(1), unless the authorization is terminated or revoked.     Resp Syncytial Virus by PCR NEGATIVE NEGATIVE Final    Comment: (NOTE) Fact Sheet for Patients: BloggerCourse.com  Fact Sheet for Healthcare Providers: SeriousBroker.it  This test is not yet approved or cleared by the [place] FDA and has been authorized for detection and/or diagnosis of SARS-CoV-2 by FDA under an Emergency Use Authorization (EUA). This EUA will remain in effect (meaning this test can be used) for the duration of the COVID-19 declaration under Section 564(b)(1) of the Act, 21 U.S.C. section 360bbb-3(b)(1), unless the authorization is terminated or revoked.  Performed at [facility], [address] [place], [place] [address]   Culture, blood (single) w Reflex to ID Panel     Status: None (Preliminary result)   Collection Time: 12/08/22 12:48 AM   Specimen: BLOOD RIGHT ARM  Result Value Ref Range Status   Specimen Description BLOOD RIGHT ARM  Final   Special Requests   Final    BOTTLES DRAWN AEROBIC AND ANAEROBIC Blood Culture adequate volume   Culture   Final    NO GROWTH 2 DAYS Performed at [facility], [address] [place], [place] [address]    Report Status PENDING  Incomplete  Culture, blood (single) w Reflex to ID Panel     Status: None (Preliminary result)   Collection Time: 12/08/22 12:48 AM   Specimen: BLOOD RIGHT ARM  Result Value Ref Range Status   Specimen Description BLOOD RIGHT ARM  Final   Special  Requests   Final    BOTTLES DRAWN AEROBIC AND ANAEROBIC Blood Culture adequate volume   Culture   Final    NO GROWTH 2 DAYS Performed at [facility], [address] [place], [place] [address]    Report Status PENDING  Incomplete    Labs: CBC: Recent Labs  Lab 12/07/22 1920 12/08/22 0452 12/09/22 0638 12/10/22 0302  WBC 5.3 14.1* 14.7* 10.5  NEUTROABS 2.6  --   --   --   HGB 12.6 11.9* 10.4* 10.5*  HCT 40.0 37.1 33.2* 33.0*  MCV 85.8 85.5 86.0 85.5  PLT 190 181 133* 135*   Basic Metabolic Panel: Recent Labs  Lab 12/07/22 1920 12/08/22 0452 12/09/22 0638 12/10/22 0302  NA 137 136 136 135  K 3.1* 3.9 3.9 3.7  CL 92* 99 102 100  CO2 32 30 27 25   GLUCOSE 148* 148* 108* 112*  BUN 35* 22 24* 22  CREATININE 1.18* 0.87 1.05* 1.05*  CALCIUM 9.0 8.4* 8.4* 8.1*  MG  --   --  2.1  --   PHOS  --   --  2.3* 3.2   Liver Function Tests: Recent Labs  Lab 12/07/22 1920  AST 35  ALT 21  ALKPHOS 88  BILITOT 0.7  PROT 7.8  ALBUMIN 3.9   CBG: Recent Labs  Lab 12/07/22 1913  GLUCAP 135*    Discharge time spent: less than 30 minutes.  Signed: Pennie Banter, DO [facility] 12/10/2022

## 2022-12-10 NOTE — Evaluation (Signed)
Occupational Therapy Evaluation Patient Details Name: Anna Lara MRN: [id number] DOB: Nov 26, 1933 Today's Date: 12/10/2022   History of Present Illness Pt is an 89 yo female that presented for near syncope, workup for sepsis and bronchopneumonia, acute hypoxia. PMH of head neck/cancer, hypertension, stage III chronic kidney disease, diastolic heart failure, hypothyroidism, dysphagia status post G-tube placement and vertigo   Clinical Impression   Anna Lara was seen for OT evaluation this date. Prior to hospital admission, pt was IND for all I/ADLs. Pt lives with son. Pt presents at baseline [place] for ADLs and functional mobility. Tolerates ~200 ft mobility no AD use, cues to navigate. No skilled acute OT needs identified, will sign off. Upon hospital discharge, recommend no OT follow up.    If plan is discharge home, recommend the following: Assist for transportation    Functional Status Assessment  Patient has not had a recent decline in their functional status  Equipment Recommendations  None recommended by OT    Recommendations for Other Services       Precautions / Restrictions Precautions Precautions: None Restrictions Weight Bearing Restrictions: No      Mobility Bed Mobility Overal bed mobility: Independent, Needs Assistance                  Transfers Overall transfer level: Independent                        Balance Overall balance assessment: Independent                                         ADL either performed or assessed with clinical judgement   ADL Overall ADL's : Independent                                              Pertinent Vitals/Pain Pain Assessment Pain Assessment: No/denies pain     Extremity/Trunk Assessment Upper Extremity Assessment Upper Extremity Assessment: Overall WFL for tasks assessed   Lower Extremity Assessment Lower Extremity Assessment: Overall WFL for  tasks assessed       Communication Communication Communication: Hearing impairment Cueing Techniques: Verbal cues   Cognition Arousal: Alert Behavior During Therapy: WFL for tasks assessed/performed Overall Cognitive Status: Within Functional Limits for tasks assessed                                                  Home Living Family/patient expects to be discharged to:: Private residence Living Arrangements: Children Available Help at Discharge: Family Type of Home: House Home Access: Stairs to enter Secretary/administrator of Steps: 1 Entrance Stairs-Rails: None Home Layout: One level               Home Equipment: None   Additional Comments: 1 step inside home that leads to living room, kitchen, bedroom.      Prior Functioning/Environment Prior Level of Function : Independent/Modified Independent             Mobility Comments: no AD use ADLs Comments: IND with tube feeds        OT Problem List: Decreased safety awareness  OT Goals(Current goals can be found in the care plan section) Acute Rehab OT Goals Patient Stated Goal: to go home OT Goal Formulation: With patient Time For Goal Achievement: 12/10/22 Potential to Achieve Goals: Good   AM-PAC OT "6 Clicks" Daily Activity     Outcome Measure Help from another person eating meals?: None Help from another person taking care of personal grooming?: None Help from another person toileting, which includes using toliet, bedpan, or urinal?: None Help from another person bathing (including washing, rinsing, drying)?: None Help from another person to put on and taking off regular upper body clothing?: None Help from another person to put on and taking off regular lower body clothing?: None 6 Click Score: 24   End of Session Nurse Communication: Mobility status  Activity Tolerance: Patient tolerated treatment well Patient left: in chair;with call bell/phone within reach  OT  Visit Diagnosis: Unsteadiness on feet (R26.81)                Time: 1610-9604 OT Time Calculation (min): 17 min Charges:  OT General Charges $OT Visit: 1 Visit OT Evaluation $OT Eval Low Complexity: 1 Low  Anna Lara, M.S. OTR/L  12/10/22, 10:10 AM  ascom [phone]

## 2022-12-10 NOTE — Progress Notes (Signed)
PT Cancellation Note  Patient Details Name: Anna Lara MRN: [id number] DOB: June 05, 1933   Cancelled Treatment:    Reason Eval/Treat Not Completed: PT screened, no needs identified, will sign off. Per OT, the pt demonstrated no acute PT needs, no mobility concerns.    Olga Coaster PT, DPT 10:54 AM,12/10/22

## 2022-12-10 NOTE — Care Management Important Message (Signed)
Important Message  Patient Details  Name: Anna Lara MRN: [id number] Date of Birth: [birth year]   Important Message Given:  N/A - LOS <3 / Initial given by admissions     Olegario Messier A Sachin Ferencz 12/10/2022, 9:34 AM

## 2022-12-10 NOTE — Assessment & Plan Note (Signed)
Due to remote head and neck cancer. Pt is NPO, exclusively uses G-tube for nutrition and medications. --Continue home TF's with Ensures TID, free water flushes 100 cc before and after each feed

## 2022-12-11 LAB — MISC LABCORP TEST (SEND OUT)

## 2022-12-12 LAB — CULTURE, BLOOD (SINGLE)
Culture: NO GROWTH
Culture: NO GROWTH
Special Requests: ADEQUATE
Special Requests: ADEQUATE

## 2023-01-03 ENCOUNTER — Ambulatory Visit: Payer: Medicare Other | Attending: Cardiology | Admitting: Cardiology

## 2023-01-03 ENCOUNTER — Ambulatory Visit: Payer: Medicare Other | Admitting: Cardiology

## 2023-01-03 ENCOUNTER — Encounter: Payer: Self-pay | Admitting: Cardiology

## 2023-01-03 VITALS — BP 132/76 | HR 94 | Ht 66.0 in | Wt 117.6 lb

## 2023-01-03 DIAGNOSIS — R011 Cardiac murmur, unspecified: Secondary | ICD-10-CM | POA: Diagnosis not present

## 2023-01-03 DIAGNOSIS — I10 Essential (primary) hypertension: Secondary | ICD-10-CM | POA: Diagnosis not present

## 2023-01-03 DIAGNOSIS — I517 Cardiomegaly: Secondary | ICD-10-CM

## 2023-01-03 DIAGNOSIS — I5032 Chronic diastolic (congestive) heart failure: Secondary | ICD-10-CM | POA: Diagnosis not present

## 2023-01-03 NOTE — Progress Notes (Signed)
Cardiology Office Note:    Date:  01/03/2023   ID:  Anna Lara, DOB [birth year], MRN [id number]  PCP:  Leanna Sato, MD   [facility] Providers Cardiologist:  None     Referring MD: Leanna Sato, MD   Chief Complaint  Patient presents with   New Patient (Initial Visit)    Patient needing to establish care.  Previously seen by [facility] in 2020.      History of Present Illness:    Anna Lara is a 89 y.o. female with a hx of hypertension, HFpEF, throat cancer, dysphagia s/p G-tube placement presenting to establish care.  Admitted last month with a bronchopneumonia in the right lower lobe.  Managed with IV antibiotics, eventually discharged on p.o. antibiotics per G-tube.  She has a history of neck cancer, exclusively uses G-tube for nutrition and medications.  Takes Toprol-XL 25 mg daily and Norvasc 10 mg daily for BP control.  Echocardiogram 10/2022 normal EF 60 to 65%, grade 2 diastolic dysfunction LVOT gradient noted, mild MR, severe concentric LVH, Aortic valve sclerosis with no stenosis also noted.  She denies edema, chest pain or shortness of breath.  Feels well, no further issues since hospital discharge.  Takes Lasix only as needed.  BP adequately controlled at home.  Past Medical History:  Diagnosis Date   Acute respiratory failure with hypoxia (HCC) 12/08/2022   Cancer (HCC)    throat cancer   Heart murmur    Hypertension    Thyroid disease    Vertigo     Past Surgical History:  Procedure Laterality Date   GASTROSTOMY TUBE PLACEMENT      Current Medications: Current Meds  Medication Sig   amLODipine (NORVASC) 10 MG tablet Take 10 mg by mouth daily.   aspirin EC 81 MG tablet Take 81 mg by mouth every evening.   feeding supplement (ENSURE ENLIVE / ENSURE PLUS) LIQD Place 237 mLs into feeding tube 3 (three) times daily.   furosemide (LASIX) 20 MG tablet Take 1 tablet (20 mg total) by mouth daily as needed (swelling or weight  gain as instructed).   guaiFENesin (ROBITUSSIN) 100 MG/5ML liquid Place 5 mLs into feeding tube 3 (three) times daily.   levothyroxine (SYNTHROID) 125 MCG tablet Take 125 mcg by mouth every morning.   metoprolol succinate (TOPROL-XL) 25 MG 24 hr tablet Take 25 mg by mouth every morning.   Water For Irrigation, Sterile (FREE WATER) SOLN Place 200 mLs into feeding tube 3 (three) times daily with meals. 100 mLs before Ensure, followed by 100 mLs after Ensure     Allergies:   Patient has no known allergies.   Social History   Socioeconomic History   Marital status: Widowed    Spouse name: Not on file   Number of children: Not on file   Years of education: Not on file   Highest education level: Not on file  Occupational History   Not on file  Tobacco Use   Smoking status: Never   Smokeless tobacco: Never  Vaping Use   Vaping status: Never Used  Substance and Sexual Activity   Alcohol use: No   Drug use: No   Sexual activity: Not on file  Other Topics Concern   Not on file  Social History Narrative   Not on file   Social Determinants of Health   Financial Resource Strain: Not on file  Food Insecurity: No Food Insecurity (10/23/2022)   Hunger Vital Sign  Worried About Programme researcher, broadcasting/film/video in the Last Year: Never true    Ran Out of Food in the Last Year: Never true  Transportation Needs: No Transportation Needs (10/23/2022)   PRAPARE - Administrator, Civil Service (Medical): No    Lack of Transportation (Non-Medical): No  Physical Activity: Not on file  Stress: Not on file  Social Connections: Not on file     Family History: The patient's family history is not on file.  ROS:   Please see the history of present illness.     All other systems reviewed and are negative.  EKGs/Labs/Other Studies Reviewed:    The following studies were reviewed today:  EKG Interpretation Date/Time:  Friday January 03 2023 15:07:47 EDT Ventricular Rate:  94 PR  Interval:  200 QRS Duration:  100 QT Interval:  366 QTC Calculation: 457 R Axis:   10  Text Interpretation: Sinus rhythm with marked sinus arrhythmia with occasional Premature ventricular complexes Left ventricular hypertrophy with repolarization abnormality ( R in aVL , Sokolow-Lyon , Cornell product , Confirmed by Debbe Odea ([id number]) on 01/03/2023 3:18:18 PM    Recent Labs: 12/07/2022: ALT 21; B Natriuretic Peptide 292.2 12/09/2022: Magnesium 2.1 12/10/2022: BUN 22; Creatinine, Ser 1.05; Hemoglobin 10.5; Platelets 135; Potassium 3.7; Sodium 135  Recent Lipid Panel No results found for: "CHOL", "TRIG", "HDL", "CHOLHDL", "VLDL", "LDLCALC", "LDLDIRECT"   Risk Assessment/Calculations:             Physical Exam:    VS:  BP 132/76 (BP Location: Left Arm, Patient Position: Sitting, Cuff Size: Normal)   Pulse 94   Ht 5\' 6"  (1.676 m)   Wt 117 lb 9.6 oz (53.3 kg)   SpO2 98%   BMI 18.98 kg/m     Wt Readings from Last 3 Encounters:  01/03/23 117 lb 9.6 oz (53.3 kg)  12/08/22 127 lb 11.2 oz (57.9 kg)  10/24/22 127 lb 3.3 oz (57.7 kg)     GEN:  Well nourished, well developed in no acute distress HEENT: Normal NECK: No JVD; No carotid bruits CARDIAC: RRR, 2/6 systolic murmur RESPIRATORY:  Clear to auscultation without rales, wheezing or rhonchi  ABDOMEN: Soft, non-tender, non-distended MUSCULOSKELETAL:  No edema; No deformity  SKIN: Warm and dry NEUROLOGIC:  Alert and oriented x 3 PSYCHIATRIC:  Normal affect   ASSESSMENT:    1. Chronic diastolic CHF (congestive heart failure) (HCC)   2. Primary hypertension   3. LVH (left ventricular hypertrophy)   4. Systolic murmur    PLAN:    In order of problems listed above:  HFpEF, appears euvolemic, EF 60 to 65%, grade 2 diastolic dysfunction.  Continue Lasix 20 mg daily as needed. Hypertension, BP controlled.  Continue Norvasc 10 mg daily, Toprol-XL 25 mg daily. Severe concentric LVH, LVOT obstruction.  Findings suggest  HCM.  Given patient's age, I do not believe additional workup with CMR, or addition of mavacamten will be much benefit.  Patient clinically asymptomatic.  Discussed with daughter in detail, recommend family screening with daughter.  Denies palpitations, dizziness or syncope. Systolic murmur, could be from aortic sclerosis versus LVOT obstruction.    Follow-up in 6 months.     Medication Adjustments/Labs and Tests Ordered: Current medicines are reviewed at length with the patient today.  Concerns regarding medicines are outlined above.  Orders Placed This Encounter  Procedures   EKG 12-Lead   No orders of the defined types were placed in this encounter.   Patient Instructions  Medication Instructions:  The current medical regimen is effective;  continue present plan and medications.  *If you need a refill on your cardiac medications before your next appointment, please call your pharmacy*   Follow-Up: At [facility], you and your health needs are our priority.  As part of our continuing mission to provide you with exceptional heart care, we have created designated Provider Care Teams.  These Care Teams include your primary Cardiologist (physician) and Advanced Practice Providers (APPs -  Physician Assistants and Nurse Practitioners) who all work together to provide you with the care you need, when you need it.  We recommend signing up for the patient portal called "MyChart".  Sign up information is provided on this After Visit Summary.  MyChart is used to connect with patients for Virtual Visits (Telemedicine).  Patients are able to view lab/test results, encounter notes, upcoming appointments, etc.  Non-urgent messages can be sent to your provider as well.   To learn more about what you can do with MyChart, go to [email or web address].    Your next appointment:   6 month(s)  Provider:   Debbe Odea, MD        Signed, Debbe Odea, MD  01/03/2023 4:00 PM      HeartCare

## 2023-01-03 NOTE — Patient Instructions (Signed)
Medication Instructions:  The current medical regimen is effective;  continue present plan and medications.  *If you need a refill on your cardiac medications before your next appointment, please call your pharmacy*   Follow-Up: At [facility], you and your health needs are our priority.  As part of our continuing mission to provide you with exceptional heart care, we have created designated Provider Care Teams.  These Care Teams include your primary Cardiologist (physician) and Advanced Practice Providers (APPs -  Physician Assistants and Nurse Practitioners) who all work together to provide you with the care you need, when you need it.  We recommend signing up for the patient portal called "MyChart".  Sign up information is provided on this After Visit Summary.  MyChart is used to connect with patients for Virtual Visits (Telemedicine).  Patients are able to view lab/test results, encounter notes, upcoming appointments, etc.  Non-urgent messages can be sent to your provider as well.   To learn more about what you can do with MyChart, go to [email or web address].    Your next appointment:   6 month(s)  Provider:   Debbe Odea, MD

## 2023-10-01 ENCOUNTER — Encounter: Payer: Self-pay | Admitting: Cardiology

## 2024-03-03 ENCOUNTER — Emergency Department
Admission: EM | Admit: 2024-03-03 | Discharge: 2024-03-03 | Disposition: A | Discharge status: Home / Self Care | Source: Ambulatory Visit

## 2024-03-03 ENCOUNTER — Emergency Department

## 2024-03-03 ENCOUNTER — Other Ambulatory Visit: Payer: Self-pay

## 2024-03-03 DIAGNOSIS — Z859 Personal history of malignant neoplasm, unspecified: Secondary | ICD-10-CM | POA: Diagnosis not present

## 2024-03-03 DIAGNOSIS — J101 Influenza due to other identified influenza virus with other respiratory manifestations: Secondary | ICD-10-CM | POA: Insufficient documentation

## 2024-03-03 DIAGNOSIS — R059 Cough, unspecified: Secondary | ICD-10-CM | POA: Diagnosis present

## 2024-03-03 DIAGNOSIS — J069 Acute upper respiratory infection, unspecified: Secondary | ICD-10-CM

## 2024-03-03 LAB — COMPREHENSIVE METABOLIC PANEL WITH GFR
ALT: 21 U/L (ref 0–44)
AST: 35 U/L (ref 15–41)
Albumin: 4 g/dL (ref 3.5–5.0)
Alkaline Phosphatase: 120 U/L (ref 38–126)
Anion gap: 13 (ref 5–15)
BUN: 27 mg/dL — ABNORMAL HIGH (ref 8–23)
CO2: 28 mmol/L (ref 22–32)
Calcium: 9.1 mg/dL (ref 8.9–10.3)
Chloride: 93 mmol/L — ABNORMAL LOW (ref 98–111)
Creatinine, Ser: 1.01 mg/dL — ABNORMAL HIGH (ref 0.44–1.00)
GFR, Estimated: 53 mL/min — ABNORMAL LOW (ref >60)
Glucose, Bld: 204 mg/dL — ABNORMAL HIGH (ref 70–99)
Potassium: 3.9 mmol/L (ref 3.5–5.1)
Sodium: 134 mmol/L — ABNORMAL LOW (ref 135–145)
Total Bilirubin: 0.4 mg/dL (ref 0.0–1.2)
Total Protein: 7.2 g/dL (ref 6.5–8.1)

## 2024-03-03 LAB — CBC
HCT: 38 % (ref 36.0–46.0)
Hemoglobin: 12.4 g/dL (ref 12.0–15.0)
MCH: 27.5 pg (ref 26.0–34.0)
MCHC: 32.6 g/dL (ref 30.0–36.0)
MCV: 84.3 fL (ref 80.0–100.0)
Platelets: 184 K/uL (ref 150–400)
RBC: 4.51 MIL/uL (ref 3.87–5.11)
RDW: 13.9 % (ref 11.5–15.5)
WBC: 16 K/uL — ABNORMAL HIGH (ref 4.0–10.5)
nRBC: 0 % (ref 0.0–0.2)

## 2024-03-03 LAB — RESP PANEL BY RT-PCR (RSV, FLU A&B, COVID)  RVPGX2
Influenza A by PCR: POSITIVE — AB
Influenza B by PCR: NEGATIVE
Resp Syncytial Virus by PCR: NEGATIVE
SARS Coronavirus 2 by RT PCR: NEGATIVE

## 2024-03-03 LAB — LIPASE, BLOOD: Lipase: 36 U/L (ref 11–51)

## 2024-03-03 LAB — TROPONIN T, HIGH SENSITIVITY: Troponin T High Sensitivity: 16 ng/L (ref 0–19)

## 2024-03-03 MED ORDER — ACETAMINOPHEN 325 MG PO TABS
975.0000 mg | ORAL_TABLET | Freq: Once | ORAL | Status: AC
  Administered 2024-03-03: 11:00:00 975 mg via ORAL
  Filled 2024-03-03: qty 3

## 2024-03-03 MED ORDER — OSELTAMIVIR PHOSPHATE 75 MG PO CAPS: 75.0000 mg | ORAL_CAPSULE | Freq: Two times a day (BID) | ORAL | 0 refills | Status: AC

## 2024-03-03 MED ORDER — DM-GUAIFENESIN ER 30-600 MG PO TB12
1.0000 | ORAL_TABLET | Freq: Two times a day (BID) | ORAL | Status: DC
  Administered 2024-03-03: 11:00:00 1 via ORAL
  Filled 2024-03-03: qty 1

## 2024-03-03 NOTE — ED Triage Notes (Signed)
 First nurse note: pt to ED from [facility] for hypotension, tachycardia, hypoxia. Pt only reports nausea. NAD noted

## 2024-03-03 NOTE — ED Triage Notes (Signed)
 Pt via POV from [facility] reports pt had low BP, high HR, and low O2. Pt denies any SOB/CP. Pt only complaint nausea since this morning. Denies any constipation or diarrhea. Denies any abd pain. Pt is A&Ox4 and NAD

## 2024-03-03 NOTE — ED Notes (Signed)
 CCMD called to initiate cardiac monitoring.

## 2024-03-03 NOTE — ED Notes (Signed)
 Fernand MD made aware of pt BP.

## 2024-03-03 NOTE — ED Provider Notes (Addendum)
 [facility] Provider Note    Event Date/Time   First MD Initiated Contact with Patient 03/03/24 1023     (approximate)   History   Nausea   HPI  Anna Lara is a 89 y.o. female with remote history of cancer presenting today with concern of cough and congestion.  This morning woke up complaining of some mild nausea cough congestion.  Daughter gave her some Zofran  which seemed to improve the nausea, given the persistent cough and congestion, daughter was planning to go to the pharmacy to pick up some Mucinex .  Decided to go to urgent care while at urgent care was noted to be tachycardic hypotensive and hypoxic causing concern and patient was instructed to come into the emergency department for further assessment and evaluation.  Patient denies any chest pain shortness of breath lightheadedness.  Primary concern is the cough and the congestion.  She was diagnosed with a bronchopneumonia about 1 year ago for which she required admission.  Patient otherwise denying complaints, daughter feels is at about baseline, and has not noticed any increased work of breathing.     Physical Exam   Triage Vital Signs: ED Triage Vitals  Encounter Vitals Group     BP 03/03/24 1008 (!) 140/68     Girls Systolic BP Percentile --      Girls Diastolic BP Percentile --      Boys Systolic BP Percentile --      Boys Diastolic BP Percentile --      Pulse Rate 03/03/24 1008 (!) 108     Resp 03/03/24 1008 18     Temp 03/03/24 1008 98.2 F (36.8 C)     Temp Source 03/03/24 1008 Oral     SpO2 03/03/24 1008 96 %     Weight 03/03/24 1007 128 lb (58.1 kg)     Height 03/03/24 1007 5' 6 (1.676 m)     Head Circumference --      Peak Flow --      Pain Score 03/03/24 1007 0     Pain Loc --      Pain Education --      Exclude from Growth Chart --     Most recent vital signs: Vitals:   03/03/24 1008 03/03/24 1319  BP: (!) 140/68 (!) 89/53  Pulse: (!) 108 84  Resp: 18 18   Temp: 98.2 F (36.8 C) 98 F (36.7 C)  SpO2: 96% 98%     General: Awake, no distress.  CV:  Good peripheral perfusion.  No noted extremity swelling Resp:  Normal effort.  Able to speak in sentences follows directions, clear breath sounds bilaterally Abd:  No distention.  Soft nontender Other:     ED Results / Procedures / Treatments   Labs (all labs ordered are listed, but only abnormal results are displayed) Labs Reviewed  RESP PANEL BY RT-PCR (RSV, FLU A&B, COVID)  RVPGX2 - Abnormal; Notable for the following components:      Result Value   Influenza A by PCR POSITIVE (*)    All other components within normal limits  COMPREHENSIVE METABOLIC PANEL WITH GFR - Abnormal; Notable for the following components:   Sodium 134 (*)    Chloride 93 (*)    Glucose, Bld 204 (*)    BUN 27 (*)    Creatinine, Ser 1.01 (*)    GFR, Estimated 53 (*)    All other components within normal limits  CBC - Abnormal; Notable for  the following components:   WBC 16.0 (*)    All other components within normal limits  LIPASE, BLOOD  TROPONIN T, HIGH SENSITIVITY     EKG  Sinus rhythm with rate of about 105, axis of 80, intervals appear to be within normal limits, there appear to be some ST depressions noted in leads V6, and prior EKG these appear to have been inverted T waves in lead V6.  Possibly secondary to lead placement.  E machine interprets this as acute STEMI but on my independent interpretation of this EKG I do not feel this is consistent with this.  RADIOLOGY No acute cardiopulmonary process appreciated  PROCEDURES:  Critical Care performed: No  Procedures   MEDICATIONS ORDERED IN ED: Medications  dextromethorphan -guaiFENesin  (MUCINEX  DM) 30-600 MG per 12 hr tablet 1 tablet (1 tablet Oral Given 03/03/24 1039)  acetaminophen  (TYLENOL ) tablet 975 mg (975 mg Oral Given 03/03/24 1039)     IMPRESSION / MDM / ASSESSMENT AND PLAN / ED COURSE  I reviewed the triage vital signs and  the nursing notes.                               Patient's presentation is most consistent with acute, uncomplicated illness.  89 year old female presenting today with concern of cough and congestion.  Clinically appears well she is not in any acute distress, congested with persistent cough while I am in the exam room.  She is tachycardic at arrival, remaining vitals appear reassuring.  Her labs do show a slight leukocytosis, but clinically she appears well and she is not in any acute distress.  Given the clinical picture along with the leukocytosis and tachycardia, we will obtain labs and attempt symptomatic management we will put her on telemetry to ensure tachycardia improves and monitor her O2 sats.  If she continues to do well and workup comes up otherwise reassuring would likely be reasonable for discharge home.  If further lab abnormalities were other acute symptoms may warrant admission.   Clinical Course as of 03/03/24 1509  Wed Mar 03, 2024  1217 Patient feeling better at this time.  Heart rate also appropriate.  Given the presentation believe reasonable for discharge home, COVID testing is still pending, discussed with the patient if they would like to wait for the results of this, they are comfortable returning home.  Discussed return precautions.  Family and patient agreeable with the plan. [SK]  1506 Patient tested positive for flu.  I called patient's daughter and informed her that I am sending in prescription of Tamiflu . [SK]    Clinical Course User Index [SK] Fernand Rossie HERO, MD     FINAL CLINICAL IMPRESSION(S) / ED DIAGNOSES   Final diagnoses:  Upper respiratory tract infection, unspecified type     Rx / DC Orders   ED Discharge Orders          Ordered    oseltamivir  (TAMIFLU ) 75 MG capsule  2 times daily        03/03/24 1509             Note:  This document was prepared using Dragon voice recognition software and may include unintentional dictation  errors.   Fernand Rossie HERO, MD 03/03/24 1507    Fernand Rossie HERO, MD 03/03/24 [phone]

## 2024-03-03 NOTE — Discharge Instructions (Signed)
 You were seen today due to concern of cough and congestion.  At this time I suspect you likely have a viral infection.  I would recommend Tylenol  and Mucinex  at home.  If you have any worsening of symptoms such as fevers, shortness of breath, confusion, or any other symptoms you find concerning please return to the emergency department immediately for further medical management.  Otherwise be sure to follow-up with your primary doctor for further assessment and evaluation.
